# Patient Record
Sex: Male | Born: 1995 | Race: White | Hispanic: No | Marital: Single | Smoking: Never smoker
Health system: Southern US, Community
[De-identification: ages and names within clinical notes are randomized; demographics above are authoritative.]

## PROBLEM LIST (undated history)

## (undated) DIAGNOSIS — F419 Anxiety disorder, unspecified: Secondary | ICD-10-CM

## (undated) DIAGNOSIS — M25371 Other instability, right ankle: Secondary | ICD-10-CM

## (undated) DIAGNOSIS — L709 Acne, unspecified: Secondary | ICD-10-CM

## (undated) HISTORY — PX: ANKLE SURGERY: SHX546

---
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
## 2017-01-30 DIAGNOSIS — Z23 Encounter for immunization: Secondary | ICD-10-CM | POA: Diagnosis not present

## 2017-05-04 DIAGNOSIS — J029 Acute pharyngitis, unspecified: Secondary | ICD-10-CM | POA: Diagnosis not present

## 2017-05-04 DIAGNOSIS — J301 Allergic rhinitis due to pollen: Secondary | ICD-10-CM | POA: Diagnosis not present

## 2017-07-18 DIAGNOSIS — M79644 Pain in right finger(s): Secondary | ICD-10-CM | POA: Diagnosis not present

## 2017-07-18 DIAGNOSIS — M25531 Pain in right wrist: Secondary | ICD-10-CM | POA: Diagnosis not present

## 2017-07-18 DIAGNOSIS — M654 Radial styloid tenosynovitis [de Quervain]: Secondary | ICD-10-CM | POA: Diagnosis not present

## 2017-10-05 DIAGNOSIS — Y9344 Activity, trampolining: Secondary | ICD-10-CM | POA: Diagnosis not present

## 2017-10-05 DIAGNOSIS — W1789XA Other fall from one level to another, initial encounter: Secondary | ICD-10-CM | POA: Diagnosis not present

## 2017-10-05 DIAGNOSIS — W1839XA Other fall on same level, initial encounter: Secondary | ICD-10-CM | POA: Diagnosis not present

## 2017-10-05 DIAGNOSIS — S6992XA Unspecified injury of left wrist, hand and finger(s), initial encounter: Secondary | ICD-10-CM | POA: Diagnosis not present

## 2017-10-05 DIAGNOSIS — M79642 Pain in left hand: Secondary | ICD-10-CM | POA: Diagnosis not present

## 2017-10-05 DIAGNOSIS — S63637A Sprain of interphalangeal joint of left little finger, initial encounter: Secondary | ICD-10-CM | POA: Diagnosis not present

## 2017-11-09 DIAGNOSIS — F331 Major depressive disorder, recurrent, moderate: Secondary | ICD-10-CM | POA: Diagnosis not present

## 2018-01-28 DIAGNOSIS — M25572 Pain in left ankle and joints of left foot: Secondary | ICD-10-CM | POA: Diagnosis not present

## 2018-02-18 DIAGNOSIS — M25572 Pain in left ankle and joints of left foot: Secondary | ICD-10-CM | POA: Diagnosis not present

## 2018-04-18 ENCOUNTER — Emergency Department (HOSPITAL_BASED_OUTPATIENT_CLINIC_OR_DEPARTMENT_OTHER)
Admission: EM | Admit: 2018-04-18 | Discharge: 2018-04-18 | Disposition: A | Discharge status: Home / Self Care | Payer: BLUE CROSS/BLUE SHIELD | Attending: Emergency Medicine | Admitting: Emergency Medicine

## 2018-04-18 ENCOUNTER — Encounter (HOSPITAL_BASED_OUTPATIENT_CLINIC_OR_DEPARTMENT_OTHER): Payer: Self-pay

## 2018-04-18 ENCOUNTER — Other Ambulatory Visit: Payer: Self-pay

## 2018-04-18 ENCOUNTER — Emergency Department (HOSPITAL_BASED_OUTPATIENT_CLINIC_OR_DEPARTMENT_OTHER): Payer: BLUE CROSS/BLUE SHIELD

## 2018-04-18 DIAGNOSIS — R0602 Shortness of breath: Secondary | ICD-10-CM | POA: Diagnosis not present

## 2018-04-18 DIAGNOSIS — R079 Chest pain, unspecified: Secondary | ICD-10-CM | POA: Insufficient documentation

## 2018-04-18 MED ORDER — HYDROXYZINE HCL 25 MG PO TABS: 25.0000 mg | ORAL_TABLET | Freq: Four times a day (QID) | ORAL | 0 refills | Status: DC

## 2018-04-18 MED ORDER — NAPROXEN 500 MG PO TABS: 500.0000 mg | ORAL_TABLET | Freq: Two times a day (BID) | ORAL | 0 refills | Status: DC

## 2018-04-18 NOTE — ED Provider Notes (Signed)
[facility] EMERGENCY DEPARTMENT Provider Note   CSN: [id number] Arrival date & time: 04/18/18  1814    History   Chief Complaint Chief Complaint  Patient presents with  . Chest Pain    HPI Brandon BoughBrandon West is a 23 y.o. male without significant past medical history who presents to the emergency department with complaints of chest pain & shortness of breath which began 2 to 3 days ago.  Patient states that his symptoms began with mild dyspnea that was coming and going without specific triggers or alleviating or aggravating factors.  He eventually developed some chest tightness as well as some chest pain that was coming and going as well.  He states that he has been up since 2 AM this morning and has since had constant chest discomfort and trouble breathing.  He states his current pain is a 5 out of 10 in severity.  He states pain may be worse with deep breaths but is not entirely sure. He notes no other alleviating or aggravating factors.  Denies URI symptoms, wheezing, fever, or chills. Denies leg pain/swelling, hemoptysis, recent surgery/trauma, recent long travel, hormone use, personal hx of cancer, or hx of DVT/PE.  Denies family history of early CAD.  Denies tobacco use marijuana use.    He does admit to increased anxiety over coronavirus. No SI/HI. No known exposures to confirmed covid 19 cases. No travel. He has been working a lot lately and not sleeping as well      HPI  Past Medical History:  Diagnosis Date  . Acne   . 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   Past Surgical History:  Procedure Laterality Date  . ANKLE ARTHROSCOPY Right 01/07/2014   Procedure: RIGHT ANKLE ARTHROSCOPY WITH LIMITED DEBRIDEMENT  ;  Surgeon: Toni ArthursJohn Hewitt, MD;  Location: [facility];  Service: Orthopedics;  Laterality: Right;  . ANKLE RECONSTRUCTION Right 01/07/2014   Procedure: RIGHT ANKLE LATERAL LIGAMENT  RECONSTRUCTION AND PERONEUS BREVIS DEBRIDEMENT;  Surgeon: Toni ArthursJohn Hewitt, MD;  Location: [facility];  Service: Orthopedics;  Laterality: Right;  . ANKLE SURGERY Right   . TEAR DUCT PROBING Bilateral 05/31/2000        Home Medications    Prior to Admission medications   Medication Sig Start Date End Date Taking? Authorizing Provider  cephALEXin (KEFLEX) 500 MG capsule Take 1 capsule (500 mg total) by mouth 2 (two) times daily. Patient not taking: Reported on 05/23/2015 06/27/14   Pisciotta, Joni ReiningNicole, PA-C  HYDROcodone-acetaminophen (NORCO) 5-325 MG per tablet Take 1-2 tablets by mouth every 6 (six) hours as needed. Patient not taking: Reported on 05/23/2015 01/07/14   Toni ArthursHewitt, John, MD  HYDROcodone-acetaminophen (NORCO/VICODIN) 5-325 MG per tablet Take 2 tablets by mouth every 4 (four) hours as needed. Patient not taking: Reported on 05/23/2015 10/04/13   Rolland PorterJames, Mark, MD  ibuprofen (ADVIL,MOTRIN) 200 MG tablet Take 400 mg by mouth every 6 (six) hours as needed for moderate pain.    [provider]  naproxen (NAPROSYN) 500 MG tablet Take 1 tablet (500 mg total) by mouth 2 (two) times daily. 05/23/15   Dowless, Lester KinsmanSamantha Tripp, PA-C    Family History Family History  Problem Relation Age of Onset  . Heart disease Maternal Grandfather        MI    Social History Social History   Tobacco Use  . Smoking status: Never Smoker  Substance Use Topics  . Alcohol use:  No  . Drug use: No     Allergies   Amoxicillin and Amoxil [amoxicillin]   Review of Systems Review of Systems  Constitutional: Negative for chills and fever.  HENT: Negative for congestion, ear pain and sore throat.   Respiratory: Positive for shortness of breath. Negative for cough, choking, wheezing and stridor.   Cardiovascular: Positive for chest pain.  Gastrointestinal: Negative for abdominal pain, nausea and vomiting.  Neurological: Negative for syncope.  Psychiatric/Behavioral: Negative for suicidal  ideas. The patient is nervous/anxious.   All other systems reviewed and are negative.    Physical Exam Updated Vital Signs BP (!) 153/91 (BP Location: Left Arm)   Pulse 66   Temp 98.5 F (36.9 C) (Oral)   Resp 18   Ht  (1.753 m)   Wt 74.8 kg   SpO2 100%   BMI 24.37 kg/m   Physical Exam Vitals signs and nursing note reviewed.  Constitutional:      General: He is not in acute distress.    Appearance: He is well-developed. He is not toxic-appearing.  HENT:     Head: Normocephalic and atraumatic.     Right Ear: Tympanic membrane normal.     Left Ear: Tympanic membrane normal.     Nose: Congestion present.     Right Sinus: No maxillary sinus tenderness or frontal sinus tenderness.     Left Sinus: No maxillary sinus tenderness or frontal sinus tenderness.     Mouth/Throat:     Mouth: Mucous membranes are moist.     Pharynx: Oropharynx is clear. Uvula midline.     Tonsils: No tonsillar exudate.  Eyes:     General:        Right eye: No discharge.        Left eye: No discharge.     Conjunctiva/sclera: Conjunctivae normal.     Pupils: Pupils are equal, round, and reactive to light.  Neck:     Musculoskeletal: Normal range of motion and neck supple. No edema, neck rigidity or crepitus.  Cardiovascular:     Rate and Rhythm: Normal rate and regular rhythm.     Pulses:          Radial pulses are 2+ on the right side and 2+ on the left side.     Heart sounds: No friction rub.  Pulmonary:     Effort: Pulmonary effort is normal. No respiratory distress.     Breath sounds: Normal breath sounds. No decreased breath sounds, wheezing, rhonchi or rales.  Chest:     Chest wall: No tenderness.  Abdominal:     General: There is no distension.     Palpations: Abdomen is soft.     Tenderness: There is no abdominal tenderness.  Musculoskeletal:     Right lower leg: He exhibits no tenderness. No edema.     Left lower leg: He exhibits no tenderness. No edema.  Skin:    General:  Skin is warm and dry.     Findings: No rash.  Neurological:     Mental Status: He is alert.     Comments: Clear speech.   Psychiatric:        Mood and Affect: Mood is anxious (mild).        Behavior: Behavior normal.        Thought Content: Thought content normal. Thought content does not include homicidal or suicidal ideation.     ED Treatments / Results  Labs (all labs ordered are listed, but only  abnormal results are displayed) Labs Reviewed - No data to display  EKG Vent. rate 56 BPM PR interval 146 ms QRS duration 96 ms QT/QTc 376/362 ms P-R-T axes 47 82 67 Sinus bradycardia with sinus arrhythmia Otherwise normal ECG No significant change was found Confirmed by Azalia Bilis ([id number]) on 04/18/2018 7:57:54 PM  Radiology Dg Chest 2 View  Result Date: 04/18/2018 CLINICAL DATA:  Generalized chest pain and shortness of breath. EXAM: CHEST - 2 VIEW COMPARISON:  None. FINDINGS: The heart size and mediastinal contours are within normal limits. Both lungs are clear. The visualized skeletal structures are unremarkable. IMPRESSION: No active cardiopulmonary disease. Electronically Signed   By: Elsie Stain M.D.   On: 04/18/2018 19:13    Procedures Procedures (including critical care time)  Medications Ordered in ED Medications - No data to display   Initial Impression / Assessment and Plan / ED Course  I have reviewed the triage vital signs and the nursing notes.  Pertinent labs & imaging results that were available during my care of the patient were reviewed by me and considered in my medical decision making (see chart for details).   Patient presents to the emergency department with chest pain and shortness of breath.  Patient nontoxic-appearing, no apparent distress, vitals WNL with the exception of mildly elevated blood pressure, doubt HTN emergency.  Patient has an overall reassuring physical exam.  Heart regular rate and rhythm.  Lungs clear to auscultation bilaterally.   No abnormalities noted to chest wall. EKG and chest x-ray per triage are reassuring.. I personally ambulated patient on the monitor, SPO2 remained at 100% and patient did not appear to have increased work of breathing or any signs of respiratory distress.   EKG with sinus rhythm, in young healthy male without RFs doubt ACS.  Chest x-ray negative for infiltrate, pneumothorax, effusion, edema, or fracture/dislocation. PERC negative doubt PE. Considered pericarditis- no friction rub on exam, no positional changes w/ pain, not classic for this. Unclear definitive etiology overall. Patient reports increased anxiety over coronavirus- no cough or fever, no travel/exposures, sxs do not seem consistent with this feel very low chance. Do feel anxiety could be playing a factor with patient's sxs. Education and re-assurance were provided. Will trial atarax for possibly anxiety component will also trial Naproxen for possible pleurisy this would also provide treatment for possible pericarditis which I feel is less likely. PCP follow up. I discussed results, treatment plan, need for follow-up, and return precautions with the patient. Provided opportunity for questions, patient confirmed understanding and is in agreement with plan.    Findings and plan of care discussed with supervising physician Dr. Patria Mane who is in agreement.    Brandon West was evaluated in Emergency Department on 04/18/2018 for the symptoms described in the history of present illness. He/she was evaluated in the context of the global COVID-19 pandemic, which necessitated consideration that the patient might be at risk for infection with the SARS-CoV-2 virus that causes COVID-19. Institutional protocols and algorithms that pertain to the evaluation of patients at risk for COVID-19 are in a state of rapid change based on information released by regulatory bodies including the CDC and federal and state organizations. These policies and algorithms were  followed during the patient's care in the ED.   Final Clinical Impressions(s) / ED Diagnoses   Final diagnoses:  Chest pain, unspecified type    ED Discharge Orders         Ordered    naproxen (NAPROSYN) 500 MG  tablet  2 times daily,   Status:  Discontinued     04/18/18 1955    hydrOXYzine (ATARAX/VISTARIL) 25 MG tablet  Every 6 hours,   Status:  Discontinued     04/18/18 1955    hydrOXYzine (ATARAX/VISTARIL) 25 MG tablet  Every 6 hours     04/18/18 2004    naproxen (NAPROSYN) 500 MG tablet  2 times daily     04/18/18 [address] 04/18/18 Abby Potash, MD 04/18/18 2045

## 2018-04-18 NOTE — ED Triage Notes (Signed)
Chest tightness with subjective SOB worsening over the last two days, pt denies  COVID exposure, denies injury, denies fevers, states they've been 97.5 at home

## 2018-04-18 NOTE — ED Notes (Signed)
2-3 days ago began having mid chest pain, feels tight, takes in a deep breath and pain increases. Last 2 hours has become worse. States nothing increases or worsens chest pain

## 2018-04-18 NOTE — ED Notes (Signed)
ED Provider at bedside. 

## 2018-04-18 NOTE — ED Notes (Signed)
DC instructions and two (2) Rx reviewed with pt, opportunity for questions provided

## 2018-04-18 NOTE — Discharge Instructions (Signed)
You were seen in the emergency department today for chest pain and trouble breathing.  Your EKG and chest x-ray were both reassuring.  We suspect your symptoms may be due to irritation of the lung lining or possibly due to anxiety.  We are sending you home with medicines to treat both of these symptoms: - Naproxen is a nonsteroidal anti-inflammatory medication that will help with pain and swelling. Be sure to take this medication as prescribed with food, 1 pill every 12 hours,  It should be taken with food, as it can cause stomach upset, and more seriously, stomach bleeding. Do not take other nonsteroidal anti-inflammatory medications with this such as Advil, Motrin, Aleve, Mobic, Goodie Powder, or Motrin.    - Atarax-this is a medication used to help anxiety, take this every 6 hours as needed.  We have prescribed you new medication(s) today. Discuss the medications prescribed today with your pharmacist as they can have adverse effects and interactions with your other medicines including over the counter and prescribed medications. Seek medical evaluation if you start to experience new or abnormal symptoms after taking one of these medicines, seek care immediately if you start to experience difficulty breathing, feeling of your throat closing, facial swelling, or rash as these could be indications of a more serious allergic reaction   Follow-up primary care within 3 days.  Return to the ER for new or worsening symptoms including but not limited to worsening pain, worsening trouble breathing, passing out, fever, productive cough, or any other concerns.

## 2018-04-29 DIAGNOSIS — R0789 Other chest pain: Secondary | ICD-10-CM | POA: Diagnosis not present

## 2018-07-18 ENCOUNTER — Emergency Department (HOSPITAL_BASED_OUTPATIENT_CLINIC_OR_DEPARTMENT_OTHER): Payer: BC Managed Care – PPO

## 2018-07-18 ENCOUNTER — Other Ambulatory Visit: Payer: Self-pay

## 2018-07-18 ENCOUNTER — Encounter (HOSPITAL_BASED_OUTPATIENT_CLINIC_OR_DEPARTMENT_OTHER): Payer: Self-pay | Admitting: Emergency Medicine

## 2018-07-18 ENCOUNTER — Emergency Department (HOSPITAL_BASED_OUTPATIENT_CLINIC_OR_DEPARTMENT_OTHER)
Admission: EM | Admit: 2018-07-18 | Discharge: 2018-07-19 | Disposition: A | Discharge status: Home / Self Care | Payer: BC Managed Care – PPO | Attending: Emergency Medicine | Admitting: Emergency Medicine

## 2018-07-18 DIAGNOSIS — R0789 Other chest pain: Secondary | ICD-10-CM | POA: Insufficient documentation

## 2018-07-18 DIAGNOSIS — Z79899 Other long term (current) drug therapy: Secondary | ICD-10-CM | POA: Diagnosis not present

## 2018-07-18 DIAGNOSIS — R0602 Shortness of breath: Secondary | ICD-10-CM | POA: Insufficient documentation

## 2018-07-18 DIAGNOSIS — R0600 Dyspnea, unspecified: Secondary | ICD-10-CM | POA: Diagnosis not present

## 2018-07-18 LAB — CBC WITH DIFFERENTIAL/PLATELET
Abs Immature Granulocytes: 0.02 10*3/uL (ref 0.00–0.07)
Basophils Absolute: 0 10*3/uL (ref 0.0–0.1)
Basophils Relative: 1 %
Eosinophils Absolute: 0.1 10*3/uL (ref 0.0–0.5)
Eosinophils Relative: 3 %
HCT: 41.3 % (ref 39.0–52.0)
Hemoglobin: 13.9 g/dL (ref 13.0–17.0)
Immature Granulocytes: 0 %
Lymphocytes Relative: 41 %
Lymphs Abs: 2.3 10*3/uL (ref 0.7–4.0)
MCH: 29.6 pg (ref 26.0–34.0)
MCHC: 33.7 g/dL (ref 30.0–36.0)
MCV: 88.1 fL (ref 80.0–100.0)
Monocytes Absolute: 0.7 10*3/uL (ref 0.1–1.0)
Monocytes Relative: 12 %
Neutro Abs: 2.5 10*3/uL (ref 1.7–7.7)
Neutrophils Relative %: 43 %
Platelets: 210 10*3/uL (ref 150–400)
RBC: 4.69 MIL/uL (ref 4.22–5.81)
RDW: 11.8 % (ref 11.5–15.5)
WBC: 5.7 10*3/uL (ref 4.0–10.5)
nRBC: 0 % (ref 0.0–0.2)

## 2018-07-18 LAB — BASIC METABOLIC PANEL
Anion gap: 12 (ref 5–15)
BUN: 24 mg/dL — ABNORMAL HIGH (ref 6–20)
CO2: 23 mmol/L (ref 22–32)
Calcium: 9.1 mg/dL (ref 8.9–10.3)
Chloride: 106 mmol/L (ref 98–111)
Creatinine, Ser: 1.09 mg/dL (ref 0.61–1.24)
GFR calc Af Amer: >60 mL/min (ref >60)
GFR calc non Af Amer: >60 mL/min (ref >60)
Glucose, Bld: 102 mg/dL — ABNORMAL HIGH (ref 70–99)
Potassium: 4 mmol/L (ref 3.5–5.1)
Sodium: 141 mmol/L (ref 135–145)

## 2018-07-18 LAB — TROPONIN I (HIGH SENSITIVITY): Troponin I (High Sensitivity): <2 ng/L (ref <18)

## 2018-07-18 NOTE — ED Triage Notes (Signed)
Pt states that yesterday he noticed shortness of breath and chest tightness that started around 1300 and was intermittent. Today around 1300 the shob and pain became constant. States had a similar episode 3 months ago and was evaluated

## 2018-07-18 NOTE — ED Provider Notes (Signed)
[facility] Provider Note   CSN: [id number] Arrival date & time: 07/18/18  2159    History   Chief Complaint Chief Complaint  Patient presents with  . Shortness of Breath  . Chest Pain    HPI Brandon West is a 23 y.o. male with PMH significant for ankle instability here for intermittent central chest pressure and shortness of breath off and on for the past month but worsened and more constant since yesterday. Chest pressure feels like something is sitting on his chest. Had similar episode a few months ago and was seen in the ED and prescribed hydroxyzine and naproxen. He is still taking naproxen but ran out of hydroxyzine. He denies any nausea, vomiting, sudden loss of consciousness, cough, congestion. Pain is not worse with exertion or deep inspiration. Taking deep breaths actually help relieve the pain. He denies FH of early cardiac disease, h/o blood clots, recent illnesses. States his symptoms may be related to anxiety but he isn't sure. Brandon West was recently diagnosed with cancer and this is stressful for him.    Past Medical History:  Diagnosis Date  . Acne   . 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   Past Surgical History:  Procedure Laterality Date  . ANKLE ARTHROSCOPY Right 01/07/2014   Procedure: RIGHT ANKLE ARTHROSCOPY WITH LIMITED DEBRIDEMENT  ;  Surgeon: Brandon Simmer, MD;  Location: [facility];  Service: Orthopedics;  Laterality: Right;  . ANKLE RECONSTRUCTION Right 01/07/2014   Procedure: RIGHT ANKLE LATERAL LIGAMENT RECONSTRUCTION AND PERONEUS BREVIS DEBRIDEMENT;  Surgeon: Brandon Simmer, MD;  Location: [facility];  Service: Orthopedics;  Laterality: Right;  . ANKLE SURGERY Right   . TEAR DUCT PROBING Bilateral 05/31/2000      Home Medications    Prior to Admission medications   Medication Sig Start Date End Date Taking? Authorizing Provider   hydrOXYzine (ATARAX/VISTARIL) 25 MG tablet Take 1 tablet (25 mg total) by mouth every 6 (six) hours. 04/18/18   Petrucelli, Samantha R, PA-C  naproxen (NAPROSYN) 500 MG tablet Take 1 tablet (500 mg total) by mouth 2 (two) times daily. 04/18/18   Petrucelli, Glynda Jaeger, PA-C    Family History Family History  Problem Relation Age of Onset  . Heart disease Maternal Grandfather        MI    Social History Social History   Tobacco Use  . Smoking status: Never Smoker  . Smokeless tobacco: Current User  Substance Use Topics  . Alcohol use: No  . Drug use: No     Allergies   Amoxicillin and Amoxil [amoxicillin]   Review of Systems Review of Systems  Constitutional: Negative for fever.  HENT: Negative for congestion, rhinorrhea and sore throat.   Respiratory: Negative for cough.   Gastrointestinal: Negative for abdominal pain, constipation, diarrhea, nausea and vomiting.  Genitourinary: Negative for difficulty urinating.  Musculoskeletal: Negative for joint swelling.  Skin: Negative for rash.  Neurological: Negative for dizziness, light-headedness and headaches.   Physical Exam Updated Vital Signs BP 126/77   Pulse 61   Temp 98.6 F (37 C) (Oral)   Resp 16   Ht 5\' 10"  (1.778 m)   Wt 77.1 kg   SpO2 99%   BMI 24.39 kg/m   Physical Exam Constitutional:      General: He is not in acute distress.    Appearance: He is well-developed. He is not ill-appearing, toxic-appearing or diaphoretic.  HENT:     Head: Normocephalic.  Cardiovascular:     Rate and Rhythm: Normal rate and regular rhythm.     Heart sounds: No murmur. No friction rub. No gallop.   Pulmonary:     Effort: Pulmonary effort is normal. No tachypnea or respiratory distress.     Breath sounds: Normal breath sounds. No stridor. No decreased breath sounds, wheezing, rhonchi or rales.  Chest:     Chest wall: No mass, tenderness or edema.  Abdominal:     General: Bowel sounds are normal.     Palpations:  Abdomen is soft.     Tenderness: There is no abdominal tenderness. There is no guarding or rebound.  Musculoskeletal: Normal range of motion.     Right lower leg: No edema.     Left lower leg: No edema.  Skin:    General: Skin is warm and dry.     Findings: No rash.  Neurological:     General: No focal deficit present.     Mental Status: He is alert and oriented to person, place, and time.    ED Treatments / Results  Labs (all labs ordered are listed, but only abnormal results are displayed) Labs Reviewed  BASIC METABOLIC PANEL - Abnormal; Notable for the following components:      Result Value   Glucose, Bld 102 (*)    BUN 24 (*)    All other components within normal limits  CBC WITH DIFFERENTIAL/PLATELET  TROPONIN I (HIGH SENSITIVITY)  TROPONIN I (HIGH SENSITIVITY)    EKG EKG Interpretation  Date/Time:  Friday July 18 2018 22:08:38 EDT Ventricular Rate:  70 PR Interval:    QRS Duration: 87 QT Interval:  377 QTC Calculation: [address] Axis:   75 Text Interpretation:  Sinus rhythm No significant change since last tracing Confirmed by Brandon West, Erin ([id number]) on 07/18/2018 10:30:39 PM   Radiology Dg Chest 2 View  Result Date: 07/18/2018 CLINICAL DATA:  23 year old male with chest tightness. EXAM: CHEST - 2 VIEW COMPARISON:  Chest radiograph dated 04/18/2018 FINDINGS: The heart size and mediastinal contours are within normal limits. Both lungs are clear. The visualized skeletal structures are unremarkable. IMPRESSION: No active cardiopulmonary disease. Electronically Signed   By: Brandon CollardArash  West M.D.   On: 07/18/2018 23:08    Procedures Procedures (including critical care time)  Medications Ordered in ED Medications - No data to display   Initial Impression / Assessment and Plan / ED Course  I have reviewed the triage vital signs and the nursing notes.  Pertinent labs & imaging results that were available during my care of the patient were reviewed by me and  considered in my medical decision making (see chart for details).   Healthy Brandon Reichmann22yo M here with 1 month h/o intermittent chest pressure and shortness of breath, worsened in the last day. Well appearing and afebrile with stable vitals. Clear lungs on exam and without U/LRI symptoms. CXR negative for pneumothorax or pneumonia. No friction rub or exacerbation of pain with position changes, EKG unchanged from prior, not likely myocarditis or pericarditis. Generally healthy and without cardiac risk factors, trop negative. Vitals stable and without h/o injury, not likely tamponade. Pain not reproducible, no h/o injury, not likely MSK. CBC negative for infection or anemia. BMP without notable abnormalities. Symptoms likely related to anxiety as previously noted, recommend close follow up with PCP. Stable for discharge.    Final Clinical Impressions(s) / ED Diagnoses   Final diagnoses:  Chest pressure  Dyspnea,  unspecified type    ED Discharge Orders    None       Ellwood DenseRumball, Devinne Epstein, DO 07/19/18 0011    Brandon West, Erin, MD 07/19/18 [id number]

## 2018-07-31 DIAGNOSIS — F419 Anxiety disorder, unspecified: Secondary | ICD-10-CM | POA: Diagnosis not present

## 2018-09-18 DIAGNOSIS — F419 Anxiety disorder, unspecified: Secondary | ICD-10-CM | POA: Diagnosis not present

## 2018-11-13 DIAGNOSIS — F419 Anxiety disorder, unspecified: Secondary | ICD-10-CM | POA: Diagnosis not present

## 2018-11-13 DIAGNOSIS — F4321 Adjustment disorder with depressed mood: Secondary | ICD-10-CM | POA: Diagnosis not present

## 2018-11-13 DIAGNOSIS — R0602 Shortness of breath: Secondary | ICD-10-CM | POA: Diagnosis not present

## 2019-01-27 ENCOUNTER — Ambulatory Visit: Payer: BC Managed Care – PPO | Attending: Internal Medicine

## 2019-01-27 DIAGNOSIS — Z20822 Contact with and (suspected) exposure to covid-19: Secondary | ICD-10-CM

## 2019-01-27 DIAGNOSIS — Z7189 Other specified counseling: Secondary | ICD-10-CM | POA: Diagnosis not present

## 2019-01-29 LAB — NOVEL CORONAVIRUS, NAA: SARS-CoV-2, NAA: NOT DETECTED

## 2019-03-03 ENCOUNTER — Other Ambulatory Visit: Payer: Self-pay

## 2019-03-03 ENCOUNTER — Ambulatory Visit (INDEPENDENT_AMBULATORY_CARE_PROVIDER_SITE_OTHER): Payer: BC Managed Care – PPO | Admitting: Mental Health

## 2019-03-03 DIAGNOSIS — F410 Panic disorder [episodic paroxysmal anxiety] without agoraphobia: Secondary | ICD-10-CM

## 2019-03-03 NOTE — Progress Notes (Signed)
[facility] Initial Adult Exam  Name: Brandon West Date: 03/03/2019 MRN: [id number] DOB: Feb 19, 1995 PCP: Orpah Melter, MD  Time spent: 53 minutes  Reason for Visit /Presenting Problem: in 2018, he was going to college. That Summer his grandfather passed away, he found him when he passed. He went to college for a semester but then came home after learning his grandmother had been dx'd w/ cancer. His grandmother passed November from cancer. The past 2 years have been difficult. He stated his anxiety is persistent, ruminates increases at night. Some days w/ low motivation.  At age 24 he coped with depression. His support system is his girlfriend, friends, family.  He works full time, questions if he will be working the same job forever. He works in Radiation protection practitioner. He is trying to figure out if what direction to go w/ his life in terms of career. He tried to go to the gym daily to cope, video games to lower anxiety but it returns.  Since last March, he began having shortness of breath and chest pressure; this occurs every few days. He went urgent care several times due to the attacks.   Mental Status Exam:   Appearance:   Casual     Behavior:  Appropriate  Motor:  Normal  Speech/Language:   Clear and Coherent  Affect:  constricted  Mood:  anxious  Thought process:  normal  Thought content:    WNL  Sensory/Perceptual disturbances:    WNL  Orientation:  x4  Attention:  Good  Concentration:  Good  Memory:  WNL  Fund of knowledge:   Good  Insight:    Good  Judgment:   Good  Impulse Control:  Good   Reported Symptoms:   Some interrupted sleep, anxiety daily 5/6 in severity, some depression  Risk Assessment: Danger to Self:  No Self-injurious Behavior: No Danger to Others: No Duty to Warn:no Physical Aggression / Violence:No  Access to Firearms a concern: No  Gang Involvement:No  Patient / guardian was educated about steps to take if suicide or homicide risk level  increases between visits: yes While future psychiatric events cannot be accurately predicted, the patient does not currently require acute inpatient psychiatric care and does not currently meet [facility] involuntary commitment criteria.  Substance Abuse History: Current substance abuse: none  Past Psychiatric History:   Outpatient Providers: therapy at age 24 History of Psych Hospitalization: none Psychological Testing: none  Abuse History: Victim - none Report needed: No. Victim of Neglect:No. Perpetrator of none  Witness / Exposure to Domestic Violence: No   Protective Services Involvement: No  Witness to [employer] Violence:  No   Family History:     Mother- Megan    Father-  Dalene Seltzer (stepfather)   Sister- Caitlyn age 19    Biological father (no contact since he was born)  Family History  Problem Relation Age of Onset  . Heart disease Maternal Grandfather        MI    Living situation: the patient lives w/ his roommate}  Sexual Orientation:  Straight  Relationship Status:  Dating gf for 2 yrs  Name of spouse / other: Lexi             If a parent, number of children / ages: none  Support Systems;  Family, friends  Financial Stress:   none  Income/Employment/Disability:  Full time  Armed forces logistics/support/administrative officer: No   Educational History: Education: high school diploma/GED  Religion/Sprituality/World View:   none  Any  cultural differences that may affect / interfere with treatment:  none  Recreation/Hobbies: video games, workout, outdoors  Stressors:  none  Strengths:  support system, motivated for change  Barriers:  none  Legal History: Pending legal issue / charges: none History of legal issue / charges: none  Medical History/Surgical History: Past Medical History:  Diagnosis Date  . Acne   . 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 ANKLE ARTHROSCOPY Right 01/07/2014   Procedure: RIGHT ANKLE ARTHROSCOPY  WITH LIMITED DEBRIDEMENT  ;  Surgeon: Toni Arthurs, MD;  Location: [facility];  Service: Orthopedics;  Laterality: Right;  . ANKLE RECONSTRUCTION Right 01/07/2014   Procedure: RIGHT ANKLE LATERAL LIGAMENT RECONSTRUCTION AND PERONEUS BREVIS DEBRIDEMENT;  Surgeon: Toni Arthurs, MD;  Location: [facility];  Service: Orthopedics;  Laterality: Right;  . ANKLE SURGERY Right   . TEAR DUCT PROBING Bilateral 05/31/2000    Medications: Current Outpatient Medications  Medication Sig Dispense Refill  . hydrOXYzine (ATARAX/VISTARIL) 25 MG tablet Take 1 tablet (25 mg total) by mouth every 6 (six) hours. 10 tablet 0  . naproxen (NAPROSYN) 500 MG tablet Take 1 tablet (500 mg total) by mouth 2 (two) times daily. 10 tablet 0   No current facility-administered medications for this visit.    Allergies  Allergen Reactions  . Amoxicillin Hives  . Amoxil [Amoxicillin] Rash    Diagnoses:  No diagnosis found.  Plan of Care: To be completed   Waldron Session, [facility]

## 2019-04-14 ENCOUNTER — Ambulatory Visit (INDEPENDENT_AMBULATORY_CARE_PROVIDER_SITE_OTHER): Payer: BC Managed Care – PPO | Admitting: Mental Health

## 2019-04-14 ENCOUNTER — Other Ambulatory Visit: Payer: Self-pay

## 2019-04-14 DIAGNOSIS — F410 Panic disorder [episodic paroxysmal anxiety] without agoraphobia: Secondary | ICD-10-CM

## 2019-04-14 NOTE — Progress Notes (Signed)
[facility] Psychotherapy note  Name: Brandon West Date: 04/14/2019 MRN: [id number] DOB: 10/21/1995 PCP: Joycelyn Rua, MD  Time spent: 53 minutes  Treatment: individual therapy  Mental Status Exam:   Appearance:   Casual     Behavior:  Appropriate  Motor:  Normal  Speech/Language:   Clear and Coherent  Affect:  constricted  Mood:  anxious  Thought process:  normal  Thought content:    WNL  Sensory/Perceptual disturbances:    WNL  Orientation:  x4  Attention:  Good  Concentration:  Good  Memory:  WNL  Fund of knowledge:   Good  Insight:    Good  Judgment:   Good  Impulse Control:  Good   Reported Symptoms:   Some interrupted sleep, anxiety daily 5/6 in severity, some depression  Risk Assessment: Danger to Self:  No Self-injurious Behavior: No Danger to Others: No Duty to Warn:no Physical Aggression / Violence:No  Access to Firearms a concern: No  Gang Involvement:No  Patient / guardian was educated about steps to take if suicide or homicide risk level increases between visits: yes While future psychiatric events cannot be accurately predicted, the patient does not currently require acute inpatient psychiatric care and does not currently meet [facility] involuntary commitment criteria.   Subjective:  Patient arrived on time for today's session.  Continue to assess some history as well as progress since our last session, recent events.  He shared how he continues to work at a [employer].  He shared how he broke up with his girlfriend about 3 weeks ago, how this is reduced some stress, going on to provide some details of the relationship and how he made this decision.  He shared some work-related stress as he continues to consider other options such as going to school, through discovery identified needing to make a change probably within the year.  He shared how this would also be a commitment he would make toward his grandmother who passed a  few months ago, as she wanted him to go to college.  He went on to share the significance of his relationship with her and also his grandfather, sharing past experiences with them growing up spending time often at their house.  He continues to report SUD as a 7 or 8 referring to his anxiety, some panic attacks during the week.  We discussed the potential benefits of engaging in a psychiatric evaluation for potential medication where he was agreeable to schedule following today's session.  Discussed diaphragmatic breathing in detail to utilize between sessions with mindfulness concepts.  Interventions: CBT, supportive therapy, problem solving approaches  Medical History/Surgical History: Past Medical History:  Diagnosis Date  . Acne   . 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 ANKLE ARTHROSCOPY Right 01/07/2014   Procedure: RIGHT ANKLE ARTHROSCOPY WITH LIMITED DEBRIDEMENT  ;  Surgeon: Toni Arthurs, MD;  Location: [facility];  Service: Orthopedics;  Laterality: Right;  . ANKLE RECONSTRUCTION Right 01/07/2014   Procedure: RIGHT ANKLE LATERAL LIGAMENT RECONSTRUCTION AND PERONEUS BREVIS DEBRIDEMENT;  Surgeon: Toni Arthurs, MD;  Location: [facility];  Service: Orthopedics;  Laterality: Right;  . ANKLE SURGERY Right   . TEAR DUCT PROBING Bilateral 05/31/2000    Medications: Current Outpatient Medications  Medication Sig Dispense Refill  . hydrOXYzine (ATARAX/VISTARIL) 25 MG tablet Take 1 tablet (25 mg total) by mouth every 6 (six) hours. 10 tablet 0  . naproxen (NAPROSYN) 500  MG tablet Take 1 tablet (500 mg total) by mouth 2 (two) times daily. 10 tablet 0   No current facility-administered medications for this visit.    Allergies  Allergen Reactions  . Amoxicillin Hives  . Amoxil [Amoxicillin] Rash    Diagnoses:    ICD-10-CM   1. Panic attack  F41.0      Plan: Patient is to use CBT, mindfulness and coping  skills to help decrease symptoms. Patient is is to follow through with coping skills discussed, as well as psychiatric evaluation.   Long-term goal:  Reduce overall level, frequency, and intensity of the feelings of anxiety and panic from SUD 7 or 8 to 0 or 1 for at least 3 consecutive months.  Short-term goal: To identify and process feelings related to grief and loss associated with his grandparents                    Decrease anxiety levels by utilizing coping skills discussed in session.                               Verbalize an understanding of the role that distorted thinking plays in creating fears, excessive worry, and ruminations.  Assessment of progress:  progressing    Anson [place], [facility]

## 2019-05-06 ENCOUNTER — Encounter: Payer: Self-pay | Admitting: Adult Health

## 2019-05-06 ENCOUNTER — Ambulatory Visit (INDEPENDENT_AMBULATORY_CARE_PROVIDER_SITE_OTHER): Payer: BC Managed Care – PPO | Admitting: Mental Health

## 2019-05-06 ENCOUNTER — Ambulatory Visit (INDEPENDENT_AMBULATORY_CARE_PROVIDER_SITE_OTHER): Payer: BC Managed Care – PPO | Admitting: Adult Health

## 2019-05-06 ENCOUNTER — Other Ambulatory Visit: Payer: Self-pay

## 2019-05-06 DIAGNOSIS — F411 Generalized anxiety disorder: Secondary | ICD-10-CM | POA: Diagnosis not present

## 2019-05-06 DIAGNOSIS — F410 Panic disorder [episodic paroxysmal anxiety] without agoraphobia: Secondary | ICD-10-CM

## 2019-05-06 MED ORDER — ESCITALOPRAM OXALATE 10 MG PO TABS: ORAL_TABLET | ORAL | 2 refills | Status: DC

## 2019-05-06 MED ORDER — LORAZEPAM 0.5 MG PO TABS: 0.5000 mg | ORAL_TABLET | Freq: Every day | ORAL | 0 refills | Status: DC | PRN

## 2019-05-06 NOTE — Progress Notes (Signed)
[facility] Psychotherapy note  Name: Brandon West Date: 05/06/2019 MRN: [id number] DOB: [birth year] PCP: Joycelyn Rua, MD  Time spent: 53 minutes  Treatment: individual therapy  Mental Status Exam:   Appearance:   Casual     Behavior:  Appropriate  Motor:  Normal  Speech/Language:   Clear and Coherent  Affect:  constricted  Mood:  anxious  Thought process:  normal  Thought content:    WNL  Sensory/Perceptual disturbances:    WNL  Orientation:  x4  Attention:  Good  Concentration:  Good  Memory:  WNL  Fund of knowledge:   Good  Insight:    Good  Judgment:   Good  Impulse Control:  Good   Reported Symptoms:   Some interrupted sleep, anxiety daily 5/6 in severity, some depression  Risk Assessment: Danger to Self:  No Self-injurious Behavior: No Danger to Others: No Duty to Warn:no Physical Aggression / Violence:No  Access to Firearms a concern: No  Gang Involvement:No  Patient / guardian was educated about steps to take if suicide or homicide risk level increases between visits: yes While future psychiatric events cannot be accurately predicted, the patient does not currently require acute inpatient psychiatric care and does not currently meet [facility] involuntary commitment criteria.   Subjective:  Patient presents for session, sharing progress.  He stated he has had more anxiety over the last few days, some recent panic episodes.  Anxiety continues to manifest more at night, rumination persists.  He was able to identify more specifics with guidance, such as worrying about keeping his house clean keeping up with tasks, some worry about finances at times, snacking more at night.  He engages in an exercise regimen, working out throughout the week and tends to worry about his food choices at times and how that may affect his weight.  Patient identified how he has to "stay busy" to keep his mind occupied so he will not began having more anxiety.  He stated  he has practiced the diaphragmatic breathing exercises discussed last session and they have been helpful at times but his anxiety persists.  We reviewed the potential benefits of medication and he has an appointment later today with Yvette Rack, NP.  We provided some psychoeducation related CBT, providing him resource for review between sessions and encouraging him to increase his awareness of his anxiety producing cognitions.  Interventions: CBT, supportive therapy, problem solving approaches  Medical History/Surgical History: Past Medical History:  Diagnosis Date  . Acne   . 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 ANKLE ARTHROSCOPY Right 01/07/2014   Procedure: RIGHT ANKLE ARTHROSCOPY WITH LIMITED DEBRIDEMENT  ;  Surgeon: Toni Arthurs, MD;  Location: [facility];  Service: Orthopedics;  Laterality: Right;  . ANKLE RECONSTRUCTION Right 01/07/2014   Procedure: RIGHT ANKLE LATERAL LIGAMENT RECONSTRUCTION AND PERONEUS BREVIS DEBRIDEMENT;  Surgeon: Toni Arthurs, MD;  Location: [facility];  Service: Orthopedics;  Laterality: Right;  . ANKLE SURGERY Right   . TEAR DUCT PROBING Bilateral 05/31/2000    Medications: Current Outpatient Medications  Medication Sig Dispense Refill  . hydrOXYzine (ATARAX/VISTARIL) 25 MG tablet Take 1 tablet (25 mg total) by mouth every 6 (six) hours. 10 tablet 0  . naproxen (NAPROSYN) 500 MG tablet Take 1 tablet (500 mg total) by mouth 2 (two) times daily. 10 tablet 0   No current facility-administered medications for this visit.    Allergies  Allergen Reactions  .  Amoxicillin Hives  . Amoxil [Amoxicillin] Rash    Diagnoses:    ICD-10-CM   1. Panic attack  F41.0      Plan: Patient is to use CBT, mindfulness and coping skills to help decrease symptoms. Patient is is to follow through with coping skills discussed, as well as psychiatric evaluation.   Long-term goal:  Reduce  overall level, frequency, and intensity of the feelings of anxiety and panic from SUD 7 or 8 to 0 or 1 for at least 3 consecutive months.  Short-term goal: To identify and process feelings related to grief and loss associated with his grandparents                    Decrease anxiety levels by utilizing coping skills discussed in session.                               Verbalize an understanding of the role that distorted thinking plays in creating fears, excessive worry, and ruminations.  Assessment of progress:  progressing    Anson [place], [facility]

## 2019-05-06 NOTE — Progress Notes (Signed)
[facility] MD/PA/NP Initial Note  05/06/2019 3:38 PM Brandon West  MRN:  [id number]  Chief Complaint:   HPI:   Describes mood today as "ok". Pleasant. Mood symptoms - denies depression. Irritable sometimes. Feels more anxious overall. Has panic attacks - "some days worse and others not at all". Seen at the ED 4 times in a year worried it was cardiac related - "everything was negative". Worries about "everything". Worries about "fitness - tight on that". Feels like anxiety is causing him to snack a lot which adds to his anxiety. Parents may be separating. Stressors over finances. Questions if he's doing this "perfect or right"? Stating "it can be as simple as grocery shopping". Keeps room neat and orderly. Likes for things to be in order. Stating "it's all in my head". Tries to talk self out of things, but can't. Lost grandfather 2 years ago unexpectedly - "I found him". Grandmother passed away of cancer 6 months later. Went to [employer] for a semester and when home for break, found out grandmother passed away. Did not return to college - took a few classes at [employer]. Stable interest and motivation. Taking medications as prescribed.  Energy levels stable. Active, has a regular exercise routine - Gym 5 to 6 days a week. Walking 15 to 20 miles a day at work.  Enjoys some usual interests and activities. Single. Lives with a room-mate . Family local - [place]. Has an 45 year old sister. Spending time with family and friends.  Appetite adequate. Weight stable. Sleeps well most nights. Averages 6 to 7 hours. Focus and concentration stable. Completing tasks. Managing aspects of household. 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  Denies SI or HI. Denies AH or VH. Consumes alcohol 3 times a week - a few beers 2 of those nights and more on the weekend.   Previous medication trials: Lexapro 7 to 8 years ago for depression.  Visit Diagnosis:    ICD-10-CM   1. Generalized anxiety disorder  F41.1    2. Panic attack  F41.0     Past Psychiatric History: Denies psychiatric hospitalization.  Past Medical History:  Past Medical History:  Diagnosis Date  . Acne   . 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 ANKLE ARTHROSCOPY Right 01/07/2014   Procedure: RIGHT ANKLE ARTHROSCOPY WITH LIMITED DEBRIDEMENT  ;  Surgeon: Toni Arthurs, MD;  Location: [facility];  Service: Orthopedics;  Laterality: Right;  . ANKLE RECONSTRUCTION Right 01/07/2014   Procedure: RIGHT ANKLE LATERAL LIGAMENT RECONSTRUCTION AND PERONEUS BREVIS DEBRIDEMENT;  Surgeon: Toni Arthurs, MD;  Location: [facility];  Service: Orthopedics;  Laterality: Right;  . ANKLE SURGERY Right   . TEAR DUCT PROBING Bilateral 05/31/2000    Family Psychiatric History: Denies any family history of mental illness. Family History:  Family History  Problem Relation Age of Onset  . Heart disease Maternal Grandfather        MI    Social History:  Social History   Socioeconomic History  . Marital status: Single    Spouse name: Not on file  . Number of children: Not on file  . Years of education: Not on file  . Highest education level: Not on file  Occupational History  . Not on file  Tobacco Use  . Smoking status: Never Smoker  . Smokeless tobacco: Current User  Substance and Sexual Activity  . Alcohol use: No  . Drug use: No  .  Sexual activity: Not on file  Other Topics Concern  . Not on file  Social History Narrative   ** Merged History Encounter **       Social Determinants of Health   Financial Resource Strain:   . Difficulty of Paying Living Expenses:   Food Insecurity:   . Worried About Charity fundraiser in the Last Year:   . Arboriculturist in the Last Year:   Transportation Needs:   . Film/video editor (Medical):   Marland Kitchen Lack of Transportation (Non-Medical):   Physical Activity:   . Days of Exercise per Week:   . Minutes of  Exercise per Session:   Stress:   . Feeling of Stress :   Social Connections:   . Frequency of Communication with Friends and Family:   . Frequency of Social Gatherings with Friends and Family:   . Attends Religious Services:   . Active Member of Clubs or Organizations:   . Attends Archivist Meetings:   Marland Kitchen Marital Status:     Allergies:  Allergies  Allergen Reactions  . Amoxicillin Hives  . Amoxil [Amoxicillin] Rash    Metabolic Disorder Labs: No results found for: HGBA1C, MPG No results found for: PROLACTIN No results found for: CHOL, TRIG, HDL, CHOLHDL, VLDL, LDLCALC No results found for: TSH  Therapeutic Level Labs: No results found for: LITHIUM No results found for: VALPROATE No components found for:  CBMZ  Current Medications: No current outpatient medications on file.   No current facility-administered medications for this visit.    Medication Side Effects: none  Orders placed this visit:  No orders of the defined types were placed in this encounter.   Psychiatric Specialty Exam:  Review of Systems  Musculoskeletal: Negative for gait problem.  Neurological: Negative for tremors.  Psychiatric/Behavioral:       Please refer to HPI    There were no vitals taken for this visit.There is no height or weight on file to calculate BMI.  General Appearance: Neat and Well Groomed  Eye Contact:  Good  Speech:  Clear and Coherent and Normal Rate  Volume:  Normal  Mood:  Anxious  Affect:  Appropriate and Congruent  Thought Process:  Coherent  Orientation:  Full (Time, Place, and Person)  Thought Content: Logical   Suicidal Thoughts:  No  Homicidal Thoughts:  No  Memory:  WNL  Judgement:  Good  Insight:  Good  Psychomotor Activity:  Normal  Concentration:  Concentration: Good  Recall:  Good  Fund of Knowledge: Good  Language: Good  Assets:  Communication Skills Desire for Improvement Financial Resources/Insurance Housing Intimacy Leisure  Time Physical Health Resilience Social Support Talents/Skills Transportation Vocational/Educational  ADL's:  Intact  Cognition: WNL  Prognosis:  Good   Screenings: None  Receiving Psychotherapy: Yes - Lanetta Inch  Treatment Plan/Recommendations:   Plan:  PDMP reviewed  1. Add Lexapro 10mg  - 1/2 tablet daily x 7 days, then one tablet daily. 2. Add Ativan 0.5mg  daily prn panic attacks  Read and reviewed note with patient for accuracy.   RTC 4 weeks  Patient advised to contact office with any questions, adverse effects, or acute worsening in signs and symptoms.  Discussed potential benefits, risk, and side effects of benzodiazepines to include potential risk of tolerance and dependence, as well as possible drowsiness.  Advised patient not to drive if experiencing drowsiness and to take lowest possible effective dose to minimize risk of dependence and tolerance.  Aloha Gell, NP

## 2019-05-18 DIAGNOSIS — F419 Anxiety disorder, unspecified: Secondary | ICD-10-CM | POA: Diagnosis not present

## 2019-05-18 DIAGNOSIS — J342 Deviated nasal septum: Secondary | ICD-10-CM | POA: Diagnosis not present

## 2019-05-18 DIAGNOSIS — R0602 Shortness of breath: Secondary | ICD-10-CM | POA: Diagnosis not present

## 2019-05-19 ENCOUNTER — Telehealth: Payer: Self-pay | Admitting: Adult Health

## 2019-05-19 ENCOUNTER — Other Ambulatory Visit: Payer: Self-pay | Admitting: Adult Health

## 2019-05-19 DIAGNOSIS — F410 Panic disorder [episodic paroxysmal anxiety] without agoraphobia: Secondary | ICD-10-CM

## 2019-05-19 DIAGNOSIS — F411 Generalized anxiety disorder: Secondary | ICD-10-CM

## 2019-05-19 MED ORDER — LORAZEPAM 0.5 MG PO TABS: ORAL_TABLET | ORAL | 2 refills | Status: AC

## 2019-05-19 NOTE — Telephone Encounter (Signed)
Script sent  

## 2019-05-19 NOTE — Telephone Encounter (Signed)
[facility] called wanting to discuss the Lorazepam.  It is not working well.  Maybe needs a higher dose or a different medication.  Please call to discuss.  Appt 5/10.  Pharmacy [facility] [phone]

## 2019-05-20 ENCOUNTER — Other Ambulatory Visit (HOSPITAL_COMMUNITY): Payer: Self-pay | Admitting: Respiratory Therapy

## 2019-05-20 DIAGNOSIS — R0602 Shortness of breath: Secondary | ICD-10-CM

## 2019-05-24 DIAGNOSIS — Z03818 Encounter for observation for suspected exposure to other biological agents ruled out: Secondary | ICD-10-CM | POA: Diagnosis not present

## 2019-05-24 DIAGNOSIS — Z20828 Contact with and (suspected) exposure to other viral communicable diseases: Secondary | ICD-10-CM | POA: Diagnosis not present

## 2019-05-25 ENCOUNTER — Ambulatory Visit: Payer: BC Managed Care – PPO | Admitting: Mental Health

## 2019-05-29 DIAGNOSIS — R05 Cough: Secondary | ICD-10-CM | POA: Diagnosis not present

## 2019-05-29 DIAGNOSIS — R0981 Nasal congestion: Secondary | ICD-10-CM | POA: Diagnosis not present

## 2019-05-29 DIAGNOSIS — J309 Allergic rhinitis, unspecified: Secondary | ICD-10-CM | POA: Diagnosis not present

## 2019-06-01 ENCOUNTER — Encounter: Payer: Self-pay | Admitting: Adult Health

## 2019-06-01 ENCOUNTER — Ambulatory Visit (INDEPENDENT_AMBULATORY_CARE_PROVIDER_SITE_OTHER): Payer: BC Managed Care – PPO | Admitting: Mental Health

## 2019-06-01 ENCOUNTER — Other Ambulatory Visit: Payer: Self-pay

## 2019-06-01 ENCOUNTER — Ambulatory Visit (INDEPENDENT_AMBULATORY_CARE_PROVIDER_SITE_OTHER): Payer: BC Managed Care – PPO | Admitting: Adult Health

## 2019-06-01 DIAGNOSIS — F410 Panic disorder [episodic paroxysmal anxiety] without agoraphobia: Secondary | ICD-10-CM

## 2019-06-01 DIAGNOSIS — F411 Generalized anxiety disorder: Secondary | ICD-10-CM

## 2019-06-01 NOTE — Progress Notes (Signed)
Brandon West [id number] 1995/11/20 24 y.o.  Subjective:   Patient ID:  Brandon West is a 24 y.o. (DOB 12/13/1995) male.  Chief Complaint: No chief complaint on file.   HPI Tou Hayner presents to the office today for follow-up of GAD and panic attacks.  Describes mood today as "ok". Pleasant. Mood symptoms - denies depression. Irritable sometimes. Feels more anxious overall. Has noticed an improvement with the Lexapro over the past week and a half. Decreased panic attacks - "not as harsh as they were". Decreased worry, but still "strong". Decreased "snacking". Eating 3 full meals a day to prevent snacking. Still experiencing obsessional thoughts and acts. Visited mother over weekend for Mother's Day. Stable interest and motivation. Taking medications as prescribed.  Energy levels stable. Active, has a regular exercise routine - Gym 5 to 6 days a week. Walking 15 to 20 miles a day at work.  Enjoys some usual interests and activities. Single. Lives with a room-mate. Family local - [place]. Has an 40 year old sister. Spending time with family and friends.  Appetite adequate. Weight stable. Sleeps better some nights than others. Averages 5 to 6 hours. Using Melatonin to get to sleep. Focus and concentration stable. Completing tasks. Managing aspects of household. 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  Denies SI or HI. Denies AH or VH. Consumes alcohol 3 times a week - a few beers 2 of those nights and more on the weekend.   Previous medication trials: Lexapro 7 to 8 years ago for depression.  Review of Systems:  Review of Systems  Musculoskeletal: Negative for gait problem.  Neurological: Negative for tremors.  Psychiatric/Behavioral:       Please refer to HPI    Medications: I have reviewed the patient's current medications.  Current Outpatient Medications  Medication Sig Dispense Refill  . escitalopram (LEXAPRO) 10 MG tablet Take 1/2 tablet daily x 7  days, then one tablet daily. 30 tablet 2  . LORazepam (ATIVAN) 0.5 MG tablet Take one tablet three times daily as needed for anxiety/paic attacks. 90 tablet 2   No current facility-administered medications for this visit.    Medication Side Effects: None  Allergies:  Allergies  Allergen Reactions  . Amoxicillin Hives  . Amoxil [Amoxicillin] Rash    Past Medical History:  Diagnosis Date  . Acne   . 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 Heart disease Maternal Grandfather        MI    Social History   Socioeconomic History  . Marital status: Single    Spouse name: Not on file  . Number of children: Not on file  . Years of education: Not on file  . Highest education level: Not on file  Occupational History  . Not on file  Tobacco Use  . Smoking status: Never Smoker  . Smokeless tobacco: Current User  Substance and Sexual Activity  . Alcohol use: No  . Drug use: No  . Sexual activity: Not on file  Other Topics Concern  . Not on file  Social History Narrative   ** Merged History Encounter **       Social Determinants of Health   Financial Resource Strain:   . Difficulty of Paying Living Expenses:   Food Insecurity:   . Worried About Programme researcher, broadcasting/film/video in the Last Year:   . Barista in the Last Year:   Transportation Needs:   .  Lack of Transportation (Medical):   Marland Kitchen Lack of Transportation (Non-Medical):   Physical Activity:   . Days of Exercise per Week:   . Minutes of Exercise per Session:   Stress:   . Feeling of Stress :   Social Connections:   . Frequency of Communication with Friends and Family:   . Frequency of Social Gatherings with Friends and Family:   . Attends Religious Services:   . Active Member of Clubs or Organizations:   . Attends Archivist Meetings:   Marland Kitchen Marital Status:   Intimate Partner Violence:   . Fear of Current or Ex-Partner:   . Emotionally Abused:   Marland Kitchen Physically  Abused:   . Sexually Abused:     Past Medical History, Surgical history, Social history, and Family history were reviewed and updated as appropriate.   Please see review of systems for further details on the patient's review from today.   Objective:   Physical Exam:  There were no vitals taken for this visit.  Physical Exam Constitutional:      General: He is not in acute distress. Musculoskeletal:        General: No deformity.  Neurological:     Mental Status: He is alert and oriented to person, place, and time.     Coordination: Coordination normal.  Psychiatric:        Attention and Perception: Attention and perception normal. He does not perceive auditory or visual hallucinations.        Mood and Affect: Mood normal. Mood is not anxious or depressed. Affect is not labile, blunt, angry or inappropriate.        Speech: Speech normal.        Behavior: Behavior normal.        Thought Content: Thought content normal. Thought content is not paranoid or delusional. Thought content does not include homicidal or suicidal ideation. Thought content does not include homicidal or suicidal plan.        Cognition and Memory: Cognition and memory normal.        Judgment: Judgment normal.     Comments: Insight intact     Lab Review:     Component Value Date/Time   NA 141 07/18/2018 2304   K 4.0 07/18/2018 2304   CL 106 07/18/2018 2304   CO2 23 07/18/2018 2304   GLUCOSE 102 (H) 07/18/2018 2304   BUN 24 (H) 07/18/2018 2304   CREATININE 1.09 07/18/2018 2304   CALCIUM 9.1 07/18/2018 2304   GFRNONAA >60 07/18/2018 2304   GFRAA >60 07/18/2018 2304       Component Value Date/Time   WBC 5.7 07/18/2018 2304   RBC 4.69 07/18/2018 2304   HGB 13.9 07/18/2018 2304   HCT 41.3 07/18/2018 2304   PLT 210 07/18/2018 2304   MCV 88.1 07/18/2018 2304   MCH 29.6 07/18/2018 2304   MCHC 33.7 07/18/2018 2304   RDW 11.8 07/18/2018 2304   LYMPHSABS 2.3 07/18/2018 2304   MONOABS 0.7 07/18/2018  2304   EOSABS 0.1 07/18/2018 2304   BASOSABS 0.0 07/18/2018 2304    No results found for: POCLITH, LITHIUM   No results found for: PHENYTOIN, PHENOBARB, VALPROATE, CBMZ   .res Assessment: Plan:   PDMP reviewed  1.  Lexapro 10mg  tablet daily. 2.  Ativan 0.5mg  BID panic attacks   RTC 4 weeks  Patient advised to contact office with any questions, adverse effects, or acute worsening in signs and symptoms.  Discussed potential benefits, risk, and side  effects of benzodiazepines to include potential risk of tolerance and dependence, as well as possible drowsiness.  Advised patient not to drive if experiencing drowsiness and to take lowest possible effective dose to minimize risk of dependence and tolerance.  Diagnoses and all orders for this visit:  Panic attack  Generalized anxiety disorder     Please see After Visit Summary for patient specific instructions.  Future Appointments  Date Time Provider Department Center  06/09/2019  2:00 PM [facility]EN[facility] None  06/11/2019 12:00 PM [facility] TECH [facility] None  06/18/2019 10:00 AM [facility], [facility] None    No orders of the defined types were placed in this encounter.   -------------------------------

## 2019-06-01 NOTE — Progress Notes (Signed)
[facility] Counselor Psychotherapy note  Name: Brandon West Date: 06/01/2019 MRN: [id number] DOB: 08/16/1995 PCP: Joycelyn Rua, MD  Time spent: 53 minutes  Treatment: individual therapy  Mental Status Exam:   Appearance:   Casual     Behavior:  Appropriate  Motor:  Normal  Speech/Language:   Clear and Coherent  Affect:  constricted  Mood:  anxious  Thought process:  normal  Thought content:    WNL  Sensory/Perceptual disturbances:    WNL  Orientation:  x4  Attention:  Good  Concentration:  Good  Memory:  WNL  Fund of knowledge:   Good  Insight:    Good  Judgment:   Good  Impulse Control:  Good   Reported Symptoms:   Some interrupted sleep, anxiety daily 5/6 in severity, some depression  Risk Assessment: Danger to Self:  No Self-injurious Behavior: No Danger to Others: No Duty to Warn:no Physical Aggression / Violence:No  Access to Firearms a concern: No  Gang Involvement:No  Patient / guardian was educated about steps to take if suicide or homicide risk level increases between visits: yes While future psychiatric events cannot be accurately predicted, the patient does not currently require acute inpatient psychiatric care and does not currently meet [facility] involuntary commitment criteria.   Subjective:  Patient presents for session sharing progress. Stated that he continues to go to the gym throughout the week, as this is one of his main outlets for stress management. He continues also to have work-related stress, sharing how he has had to work many hours of overtime. He is not satisfied with his job, wants to make a change and is considering moving back to his parents house possibly in August and engaging in Medical sales representative school. He also feels being there with him will give him some needed support and comfort, he is often alone in his apartment, roommate typically gone for 3 weeks at a time. This appears to affect him, increasing his rumination particularly  at night. He has started taking melatonin with some positive effects to get to sleep quicker, may take about 30 to 40 minutes versus a few hours. Feels his current medications have been helpful and plans to attend his medication management appointment later today. He identified negative cognitions such as "I'm backtracking" referring to his progress and possibly having to move back home. We assisted him in identifying how he is identifying needed steps to take for self-care. We reviewed coping, diaphragmatic breathing and some CBT concepts, thought blocking.  Interventions: CBT, supportive therapy, problem solving approaches  Medical History/Surgical History: Past Medical History:  Diagnosis Date  . Acne   . 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 ANKLE ARTHROSCOPY Right 01/07/2014   Procedure: RIGHT ANKLE ARTHROSCOPY WITH LIMITED DEBRIDEMENT  ;  Surgeon: Toni Arthurs, MD;  Location: [facility];  Service: Orthopedics;  Laterality: Right;  . ANKLE RECONSTRUCTION Right 01/07/2014   Procedure: RIGHT ANKLE LATERAL LIGAMENT RECONSTRUCTION AND PERONEUS BREVIS DEBRIDEMENT;  Surgeon: Toni Arthurs, MD;  Location: [facility];  Service: Orthopedics;  Laterality: Right;  . ANKLE SURGERY Right   . TEAR DUCT PROBING Bilateral 05/31/2000    Medications: Current Outpatient Medications  Medication Sig Dispense Refill  . escitalopram (LEXAPRO) 10 MG tablet Take 1/2 tablet daily x 7 days, then one tablet daily. 30 tablet 2  . LORazepam (ATIVAN) 0.5 MG tablet Take one tablet three times daily as needed for anxiety/paic attacks. 90 tablet  2   No current facility-administered medications for this visit.    Allergies  Allergen Reactions  . Amoxicillin Hives  . Amoxil [Amoxicillin] Rash    Diagnoses:    ICD-10-CM   1. Panic attack  F41.0      Plan: Patient is to use CBT, mindfulness and coping skills to help decrease  symptoms. Patient is is to follow through with coping skills discussed, as well as psychiatric evaluation.   Long-term goal:  Reduce overall level, frequency, and intensity of the feelings of anxiety and panic from SUD 7 or 8 to 0 or 1 for at least 3 consecutive months.  Short-term goal: To identify and process feelings related to grief and loss associated with his grandparents                    Decrease anxiety levels by utilizing coping skills discussed in session.                               Verbalize an understanding of the role that distorted thinking plays in creating fears, excessive worry, and ruminations.  Assessment of progress:  progressing    Anson [place], [facility]

## 2019-06-02 ENCOUNTER — Ambulatory Visit: Payer: BC Managed Care – PPO | Admitting: Mental Health

## 2019-06-03 ENCOUNTER — Ambulatory Visit: Payer: BC Managed Care – PPO | Admitting: Adult Health

## 2019-06-09 ENCOUNTER — Other Ambulatory Visit (HOSPITAL_COMMUNITY): Payer: BC Managed Care – PPO

## 2019-06-11 ENCOUNTER — Encounter (HOSPITAL_COMMUNITY): Payer: BC Managed Care – PPO

## 2019-06-12 DIAGNOSIS — J309 Allergic rhinitis, unspecified: Secondary | ICD-10-CM | POA: Diagnosis not present

## 2019-06-16 ENCOUNTER — Ambulatory Visit: Payer: BC Managed Care – PPO | Admitting: Mental Health

## 2019-06-18 ENCOUNTER — Ambulatory Visit: Payer: BC Managed Care – PPO | Admitting: Mental Health

## 2019-06-29 ENCOUNTER — Ambulatory Visit: Payer: BC Managed Care – PPO | Admitting: Adult Health

## 2019-07-03 ENCOUNTER — Other Ambulatory Visit: Payer: Self-pay | Admitting: Adult Health

## 2019-07-03 DIAGNOSIS — F411 Generalized anxiety disorder: Secondary | ICD-10-CM

## 2019-07-23 ENCOUNTER — Ambulatory Visit (INDEPENDENT_AMBULATORY_CARE_PROVIDER_SITE_OTHER): Payer: BC Managed Care – PPO | Admitting: Mental Health

## 2019-07-23 ENCOUNTER — Other Ambulatory Visit: Payer: Self-pay

## 2019-07-23 DIAGNOSIS — F410 Panic disorder [episodic paroxysmal anxiety] without agoraphobia: Secondary | ICD-10-CM | POA: Diagnosis not present

## 2019-07-23 DIAGNOSIS — F411 Generalized anxiety disorder: Secondary | ICD-10-CM | POA: Diagnosis not present

## 2019-07-23 NOTE — Progress Notes (Signed)
[facility] Psych[facility] note  Name: Brandon West Date: 07/23/2019 MRN: [id number] DOB: [birth year] PCP: Joycelyn Rua, MD  Time spent: 53 minutes  Treatment: individual therapy  Mental Status Exam:   Appearance:   Casual     Behavior:  Appropriate  Motor:  Normal  Speech/Language:   Clear and Coherent  Affect:  constricted  Mood:  anxious  Thought process:  normal  Thought content:    WNL  Sensory/Perceptual disturbances:    WNL  Orientation:  x4  Attention:  Good  Concentration:  Good  Memory:  WNL  Fund of knowledge:   Good  Insight:    Good  Judgment:   Good  Impulse Control:  Good   Reported Symptoms:   Some interrupted sleep, anxiety daily 5/6 in severity, some depression  Risk Assessment: Danger to Self:  No Self-injurious Behavior: No Danger to Others: No Duty to Warn:no Physical Aggression / Violence:No  Access to Firearms a concern: No  Gang Involvement:No  Patient / guardian was educated about steps to take if suicide or homicide risk level increases between visits: yes While future psychiatric events cannot be accurately predicted, the patient does not currently require acute inpatient psychiatric care and does not currently meet [facility] involuntary commitment criteria.   Subjective:  Patient presents for session on time.  We discussed progress since last visit which was about a month and a half ago.  He stated that he has some obsessive tendencies he wants to work on in therapy.  He stated that he counts calories daily.  He shared how about a year ago he became more health focused, working out and looked carefully at his food choices and diet.  Although this is healthy steps, he finds it very difficult not to count calories of everything he consumes, snacks meals etc.  He wants to have a healthy balance with eating healthy foods as well as being able to eat other foods that are not necessarily health focused.  He realizes how this ties into  his anxiety daily.  He stated that he stopped drinking alcohol about a week ago, that he was drinking up to about 12 beers a day to cope with stress and anxiety but how he had a negative impact on how he felt daily.  He shared some family history related to his father having an alcohol problem describing it as drinking daily and somewhat excessive amounts.  Made plan for him to not count calories for at least 3 days/week, only the first part of the day until after lunch, then he can count the evening calories.  He plans to spread the 3 days out evenly over the week and also made a plan to eat at least 1 enjoyable food choice that was not helpful focused and also not counting calories in this instance.  When not counting calories, this means not inputting the data in his phone.   Patient presents for session sharing progress. Stated that he continues to go to the gym throughout the week, as this is one of his main outlets for stress management. He continues also to have work-related stress, sharing how he has had to work many hours of overtime. He is not satisfied with his job, wants to make a change and is considering moving back to his parents house possibly in August and engaging in Medical sales representative school. He also feels being there with him will give him some needed support and comfort, he is often alone in his apartment, roommate  typically gone for 3 weeks at a time. This appears to affect him, increasing his rumination particularly at night. He has started taking melatonin with some positive effects to get to sleep quicker, may take about 30 to 40 minutes versus a few hours. Feels his current medications have been helpful and plans to attend his medication management appointment later today. He identified negative cognitions such as "I'm backtracking" referring to his progress and possibly having to move back home. We assisted him in identifying how he is identifying needed steps to take for self-care. We  reviewed coping, diaphragmatic breathing and some CBT concepts, thought blocking.  Interventions: CBT, supportive therapy, problem solving approaches  Medical History/Surgical History: Past Medical History:  Diagnosis Date  . Acne   . 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 ANKLE ARTHROSCOPY Right 01/07/2014   Procedure: RIGHT ANKLE ARTHROSCOPY WITH LIMITED DEBRIDEMENT  ;  Surgeon: Toni Arthurs, MD;  Location: [facility];  Service: Orthopedics;  Laterality: Right;  . ANKLE RECONSTRUCTION Right 01/07/2014   Procedure: RIGHT ANKLE LATERAL LIGAMENT RECONSTRUCTION AND PERONEUS BREVIS DEBRIDEMENT;  Surgeon: Toni Arthurs, MD;  Location: [facility];  Service: Orthopedics;  Laterality: Right;  . ANKLE SURGERY Right   . TEAR DUCT PROBING Bilateral 05/31/2000    Medications: Current Outpatient Medications  Medication Sig Dispense Refill  . escitalopram (LEXAPRO) 10 MG tablet TAKE 1/2 TABLET BY MOUTH ONCE A DAY FOR 7 DAYS ,THEN TAKE 1 TABLET BY MOUTH EVERY DAY 90 tablet 1  . LORazepam (ATIVAN) 0.5 MG tablet Take one tablet three times daily as needed for anxiety/paic attacks. 90 tablet 2   No current facility-administered medications for this visit.    Allergies  Allergen Reactions  . Amoxicillin Hives  . Amoxil [Amoxicillin] Rash    Diagnoses:    ICD-10-CM   1. Panic attack  F41.0   2. Generalized anxiety disorder  F41.1      Plan: Patient is to use CBT, mindfulness and coping skills to help decrease symptoms. Patient is is to follow through with coping skills discussed, as well as psychiatric evaluation.   Long-term goal:  Reduce overall level, frequency, and intensity of the feelings of anxiety and panic from SUD 7 or 8 to 0 or 1 for at least 3 consecutive months.  Short-term goal: To identify and process feelings related to grief and loss associated with his grandparents                    Decrease  anxiety levels by utilizing coping skills discussed in session.                               Verbalize an understanding of the role that distorted thinking plays in creating fears, excessive worry, and ruminations.  Assessment of progress:  progressing    Waldron Session, [facility]

## 2019-08-10 DIAGNOSIS — T3390XA Superficial frostbite of unspecified sites, initial encounter: Secondary | ICD-10-CM | POA: Diagnosis not present

## 2019-08-28 ENCOUNTER — Ambulatory Visit: Payer: BC Managed Care – PPO | Admitting: Mental Health

## 2019-09-09 ENCOUNTER — Ambulatory Visit: Payer: BC Managed Care – PPO | Admitting: Mental Health

## 2019-11-24 ENCOUNTER — Ambulatory Visit (HOSPITAL_COMMUNITY)
Admission: EM | Admit: 2019-11-24 | Discharge: 2019-11-24 | Disposition: A | Discharge status: Home / Self Care | Payer: BC Managed Care – PPO | Attending: Family Medicine | Admitting: Family Medicine

## 2019-11-24 ENCOUNTER — Other Ambulatory Visit: Payer: Self-pay

## 2019-11-24 ENCOUNTER — Encounter (HOSPITAL_COMMUNITY): Payer: Self-pay

## 2019-11-24 DIAGNOSIS — S76301A Unspecified injury of muscle, fascia and tendon of the posterior muscle group at thigh level, right thigh, initial encounter: Secondary | ICD-10-CM

## 2019-11-24 MED ORDER — HYDROCODONE-ACETAMINOPHEN 5-325 MG PO TABS: 1.0000 | ORAL_TABLET | Freq: Four times a day (QID) | ORAL | 0 refills | Status: AC | PRN

## 2019-11-24 NOTE — Discharge Instructions (Signed)

## 2019-11-24 NOTE — ED Triage Notes (Signed)
Pt in c/o sharp right leg pain. States that he was at the gym and bent down to put down dumbbells and felt a "snap or tear"  Pt has not had pain medication  States that pain is worse when leg is bent  Denies any numbness or tingling to lower extremities

## 2019-11-25 DIAGNOSIS — S76311A Strain of muscle, fascia and tendon of the posterior muscle group at thigh level, right thigh, initial encounter: Secondary | ICD-10-CM | POA: Diagnosis not present

## 2019-11-25 NOTE — ED Provider Notes (Signed)
[facility]   [id number] 11/24/19 Arrival Time: 1934  ASSESSMENT & PLAN:  1. Hamstring injury, right, initial encounter     No indication for plain imaging.  OTC ibup 800 TID with food.  Meds ordered this encounter  Medications  . HYDROcodone-acetaminophen (NORCO/VICODIN) 5-325 MG tablet    Sig: Take 1 tablet by mouth every 6 (six) hours as needed for moderate pain or severe pain.    Dispense:  8 tablet    Refill:  0   WBAT.  Recommend:  Follow-up Information    Schedule an appointment as soon as possible for a visit  with [facility].   Contact information: [address] [place] [address] [phone]              Reviewed expectations re: course of current medical issues. Questions answered. Outlined signs and symptoms indicating need for more acute intervention. Patient verbalized understanding. After Visit Summary given.  SUBJECTIVE: History from: patient. Brandon West is a 24 y.o. male who reports abrupt onset of R hamstring pain while at gym; bent down 'and felt a pop' in hamstring; immediate pain. Worse with any movement and wt bearing. No extremity sensation changes or weakness. No OTC tx.    Past Surgical History:  Procedure Laterality Date  . ANKLE ARTHROSCOPY Right 01/07/2014   Procedure: RIGHT ANKLE ARTHROSCOPY WITH LIMITED DEBRIDEMENT  ;  Surgeon: Toni Arthurs, MD;  Location: [facility];  Service: Orthopedics;  Laterality: Right;  . ANKLE RECONSTRUCTION Right 01/07/2014   Procedure: RIGHT ANKLE LATERAL LIGAMENT RECONSTRUCTION AND PERONEUS BREVIS DEBRIDEMENT;  Surgeon: Toni Arthurs, MD;  Location: [facility];  Service: Orthopedics;  Laterality: Right;  . ANKLE SURGERY Right   . TEAR DUCT PROBING Bilateral 05/31/2000      OBJECTIVE:  Vitals:   11/24/19 1949  BP: 130/77  Pulse: 62  Resp: 18  SpO2: 98%    General appearance: alert; no  distress HEENT: [place]; AT Neck: supple with FROM Resp: unlabored respirations Extremities: . RLE: warm with well perfused appearance; fairly well localized significant tenderness over right distal hamstring without bunching of muscle; no bruising Skin: warm and dry; no visible rashes Neurologic: gait favors RLE; normal sensation and strength of RLE Psychological: alert and cooperative; normal mood and affect   Allergies  Allergen Reactions  . Amoxicillin Hives  . Amoxil [Amoxicillin] Rash    Past Medical History:  Diagnosis Date  . Acne   . 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 Marital status: Single    Spouse name: Not on file  . Number of children: Not on file  . Years of education: Not on file  . Highest education level: Not on file  Occupational History  . Not on file  Tobacco Use  . Smoking status: Never Smoker  . Smokeless tobacco: Current User  Vaping Use  . Vaping Use: Former  Substance and Sexual Activity  . Alcohol use: No  . Drug use: No  . Sexual activity: Not on file  Other Topics Concern  . Not on file  Social History Narrative   ** Merged History Encounter **       Social Determinants of Health   Financial Resource Strain:   . Difficulty of Paying Living Expenses: Not on file  Food Insecurity:   . Worried About Programme researcher, broadcasting/film/video in the Last Year: Not on file  . Ran  Out of Food in the Last Year: Not on file  Transportation Needs:   . Lack of Transportation (Medical): Not on file  . Lack of Transportation (Non-Medical): Not on file  Physical Activity:   . Days of Exercise per Week: Not on file  . Minutes of Exercise per Session: Not on file  Stress:   . Feeling of Stress : Not on file  Social Connections:   . Frequency of Communication with Friends and Family: Not on file  . Frequency of Social Gatherings with Friends and Family: Not on file  . Attends Religious Services: Not on file  . Active Member  of Clubs or Organizations: Not on file  . Attends Banker Meetings: Not on file  . Marital Status: Not on file   Family History  Problem Relation Age of Onset  . Heart disease Maternal Grandfather        MI  . Healthy Mother   . Healthy Father    Past Surgical History:  Procedure Laterality Date  . ANKLE ARTHROSCOPY Right 01/07/2014   Procedure: RIGHT ANKLE ARTHROSCOPY WITH LIMITED DEBRIDEMENT  ;  Surgeon: Toni Arthurs, MD;  Location: [facility];  Service: Orthopedics;  Laterality: Right;  . ANKLE RECONSTRUCTION Right 01/07/2014   Procedure: RIGHT ANKLE LATERAL LIGAMENT RECONSTRUCTION AND PERONEUS BREVIS DEBRIDEMENT;  Surgeon: Toni Arthurs, MD;  Location: [facility];  Service: Orthopedics;  Laterality: Right;  . ANKLE SURGERY Right   . TEAR DUCT PROBING Bilateral 05/31/2000      Mardella Layman, MD 11/25/19 [phone]

## 2019-12-09 DIAGNOSIS — S76311A Strain of muscle, fascia and tendon of the posterior muscle group at thigh level, right thigh, initial encounter: Secondary | ICD-10-CM | POA: Diagnosis not present

## 2019-12-25 ENCOUNTER — Other Ambulatory Visit: Payer: Self-pay | Admitting: Adult Health

## 2019-12-25 DIAGNOSIS — F411 Generalized anxiety disorder: Secondary | ICD-10-CM

## 2019-12-29 DIAGNOSIS — Z20822 Contact with and (suspected) exposure to covid-19: Secondary | ICD-10-CM | POA: Diagnosis not present

## 2019-12-29 DIAGNOSIS — R109 Unspecified abdominal pain: Secondary | ICD-10-CM | POA: Diagnosis not present

## 2019-12-29 DIAGNOSIS — R5383 Other fatigue: Secondary | ICD-10-CM | POA: Diagnosis not present

## 2019-12-29 DIAGNOSIS — Z1152 Encounter for screening for COVID-19: Secondary | ICD-10-CM | POA: Diagnosis not present

## 2020-04-19 ENCOUNTER — Emergency Department (HOSPITAL_BASED_OUTPATIENT_CLINIC_OR_DEPARTMENT_OTHER): Payer: BC Managed Care – PPO

## 2020-04-19 ENCOUNTER — Other Ambulatory Visit: Payer: Self-pay

## 2020-04-19 ENCOUNTER — Emergency Department (HOSPITAL_BASED_OUTPATIENT_CLINIC_OR_DEPARTMENT_OTHER)
Admission: EM | Admit: 2020-04-19 | Discharge: 2020-04-19 | Disposition: A | Discharge status: Home / Self Care | Payer: BC Managed Care – PPO | Attending: Emergency Medicine | Admitting: Emergency Medicine

## 2020-04-19 ENCOUNTER — Encounter (HOSPITAL_BASED_OUTPATIENT_CLINIC_OR_DEPARTMENT_OTHER): Payer: Self-pay

## 2020-04-19 DIAGNOSIS — F17220 Nicotine dependence, chewing tobacco, uncomplicated: Secondary | ICD-10-CM | POA: Insufficient documentation

## 2020-04-19 DIAGNOSIS — M25561 Pain in right knee: Secondary | ICD-10-CM | POA: Insufficient documentation

## 2020-04-19 NOTE — ED Triage Notes (Signed)
Pt presents with right medial and posterior knee pain. States started with discomfort on Thursday, went to work yesterday on his feet for 10 hours and pain worsened. Denies known injury

## 2020-04-19 NOTE — Discharge Instructions (Addendum)
Recommend ice.  Recommend 800 mg of Motrin every 8 hours for the next 5 days and then as needed, recommend 1000 mg of Tylenol every 6 hours for the next 5 days and then as needed.  Follow-up with your orthopedic doctor follow-up with sports medicine/Dr. Jordan Likes.

## 2020-04-19 NOTE — ED Provider Notes (Signed)
[facility] EMERGENCY DEPARTMENT Provider Note   CSN: [id number] Arrival date & time: 04/19/20  1304     History Chief Complaint  Patient presents with  . Knee Pain    Brandon West is a 25 y.o. male.  The history is provided by the patient.  Knee Pain Location:  Knee Knee location:  R knee Pain details:    Quality:  Aching   Severity:  Mild   Onset quality:  Gradual   Timing:  Constant   Progression:  Unchanged Chronicity:  New Relieved by:  Nothing Worsened by:  Bearing weight Associated symptoms: stiffness   Associated symptoms: no back pain, no decreased ROM, no fatigue, no fever, no itching, no muscle weakness, no neck pain, no numbness, no swelling and no tingling        Past Medical History:  Diagnosis Date  . Acne   . 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   Past Surgical History:  Procedure Laterality Date  . ANKLE ARTHROSCOPY Right 01/07/2014   Procedure: RIGHT ANKLE ARTHROSCOPY WITH LIMITED DEBRIDEMENT  ;  Surgeon: Toni Arthurs, MD;  Location: [facility];  Service: Orthopedics;  Laterality: Right;  . ANKLE RECONSTRUCTION Right 01/07/2014   Procedure: RIGHT ANKLE LATERAL LIGAMENT RECONSTRUCTION AND PERONEUS BREVIS DEBRIDEMENT;  Surgeon: Toni Arthurs, MD;  Location: [facility];  Service: Orthopedics;  Laterality: Right;  . ANKLE SURGERY Right   . TEAR DUCT PROBING Bilateral 05/31/2000       Family History  Problem Relation Age of Onset  . Heart disease Maternal Grandfather        MI  . Healthy Mother   . Healthy Father     Social History   Tobacco Use  . Smoking status: Never Smoker  . Smokeless tobacco: Current User  Vaping Use  . Vaping Use: Former  Substance Use Topics  . Alcohol use: Yes    Comment: occasional  . Drug use: No    Home Medications Prior to Admission medications   Medication Sig Start Date End Date Taking? Authorizing Provider   escitalopram (LEXAPRO) 10 MG tablet TAKE 1/2 TABLET BY MOUTH ONCE A DAY FOR 7 DAYS ,THEN TAKE 1 TABLET BY MOUTH EVERY DAY 12/25/19  Yes Mozingo, Thereasa Solo, NP  HYDROcodone-acetaminophen (NORCO/VICODIN) 5-325 MG tablet Take 1 tablet by mouth every 6 (six) hours as needed for moderate pain or severe pain. 11/24/19   Mardella Layman, MD  LORazepam (ATIVAN) 0.5 MG tablet Take one tablet three times daily as needed for anxiety/paic attacks. 05/19/19   Mozingo, Thereasa Solo, NP    Allergies    Amoxicillin and Amoxil [amoxicillin]  Review of Systems   Review of Systems  Constitutional: Negative for fatigue and fever.  Musculoskeletal: Positive for arthralgias, gait problem and stiffness. Negative for back pain and neck pain.  Skin: Negative for color change, itching, pallor, rash and wound.  Neurological: Negative for weakness and numbness.    Physical Exam Updated Vital Signs BP 125/77 (BP Location: Right Arm)   Pulse 63   Temp 98.9 F (37.2 C) (Oral)   Resp 16   Ht 5\' 9"  (1.753 m)   Wt 74.8 kg   SpO2 100%   BMI 24.37 kg/m   Physical Exam Constitutional:      General: He is not in acute distress.    Appearance: He is not ill-appearing.  Musculoskeletal:        General:  Tenderness (TTP to medial portion of right knee) present. No swelling or deformity. Normal range of motion.     Cervical back: Normal range of motion.  Skin:    General: Skin is warm.     Capillary Refill: Capillary refill takes less than 2 seconds.  Neurological:     Mental Status: He is alert.     Sensory: No sensory deficit.     Motor: No weakness.     ED Results / Procedures / Treatments   Labs (all labs ordered are listed, but only abnormal results are displayed) Labs Reviewed - No data to display  EKG None  Radiology No results found.  Procedures Procedures   Medications Ordered in ED Medications - No data to display  ED Course  I have reviewed the triage vital signs and the nursing  notes.  Pertinent labs & imaging results that were available during my care of the patient were reviewed by me and considered in my medical decision making (see chart for details).    MDM Rules/Calculators/A&P                          Brandon West is here with right knee pain.  No specific trauma.  Normal vitals.  No fever.  Tenderness to the medial portion of the right knee.  May be a meniscal injury, could be a tendinitis.  Does not have any swelling of the knee or the leg.  No risk factors for blood clots.  No concern for blood clot.  Overall suspect musculoskeletal process.  No concern for major ACL or MCL tear but could have a mild sprain.  We will have him follow-up with sports medicine.  Recommend anti-inflammatories.  X-ray showed no fracture or malalignment.  Neurovascularly intact on exam.  No concern for septic joint.  This chart was dictated using voice recognition software.  Despite best efforts to proofread,  errors can occur which can change the documentation meaning.   Final Clinical Impression(s) / ED Diagnoses Final diagnoses:  Acute pain of right knee    Rx / DC Orders ED Discharge Orders    None       Virgina Norfolk, DO 04/19/20 1332

## 2020-04-22 DIAGNOSIS — M7051 Other bursitis of knee, right knee: Secondary | ICD-10-CM | POA: Diagnosis not present

## 2020-05-06 DIAGNOSIS — M25561 Pain in right knee: Secondary | ICD-10-CM | POA: Diagnosis not present

## 2020-05-31 DIAGNOSIS — M25561 Pain in right knee: Secondary | ICD-10-CM | POA: Diagnosis not present

## 2020-06-15 DIAGNOSIS — S8001XS Contusion of right knee, sequela: Secondary | ICD-10-CM | POA: Diagnosis not present

## 2020-06-28 DIAGNOSIS — M25561 Pain in right knee: Secondary | ICD-10-CM | POA: Diagnosis not present

## 2020-07-12 DIAGNOSIS — M25561 Pain in right knee: Secondary | ICD-10-CM | POA: Diagnosis not present

## 2020-07-20 DIAGNOSIS — M25561 Pain in right knee: Secondary | ICD-10-CM | POA: Diagnosis not present

## 2020-10-28 ENCOUNTER — Other Ambulatory Visit: Payer: Self-pay | Admitting: Adult Health

## 2020-10-28 DIAGNOSIS — F411 Generalized anxiety disorder: Secondary | ICD-10-CM

## 2020-11-26 ENCOUNTER — Other Ambulatory Visit: Payer: Self-pay | Admitting: Adult Health

## 2020-11-26 DIAGNOSIS — F411 Generalized anxiety disorder: Secondary | ICD-10-CM

## 2020-12-01 ENCOUNTER — Telehealth: Payer: Self-pay | Admitting: Adult Health

## 2020-12-01 DIAGNOSIS — F411 Generalized anxiety disorder: Secondary | ICD-10-CM

## 2020-12-01 NOTE — Telephone Encounter (Signed)
Called Pt to schedule apt. Advised to receive meds you have to be seen. Pt stated he will check work schedule and call back to schedule.

## 2020-12-01 NOTE — Telephone Encounter (Signed)
Please schedule appt.Provider refused request already due to no appt.

## 2020-12-08 NOTE — Telephone Encounter (Signed)
Pt aware must have apt to continue Rx.

## 2021-08-21 DIAGNOSIS — M25561 Pain in right knee: Secondary | ICD-10-CM | POA: Diagnosis not present

## 2021-08-30 DIAGNOSIS — M23206 Derangement of unspecified meniscus due to old tear or injury, right knee: Secondary | ICD-10-CM | POA: Diagnosis not present

## 2021-08-30 DIAGNOSIS — M25561 Pain in right knee: Secondary | ICD-10-CM | POA: Diagnosis not present

## 2021-09-04 DIAGNOSIS — M25561 Pain in right knee: Secondary | ICD-10-CM | POA: Diagnosis not present

## 2021-09-11 DIAGNOSIS — M6751 Plica syndrome, right knee: Secondary | ICD-10-CM | POA: Diagnosis not present

## 2021-09-14 DIAGNOSIS — F419 Anxiety disorder, unspecified: Secondary | ICD-10-CM | POA: Diagnosis not present

## 2021-09-14 DIAGNOSIS — Z6825 Body mass index (BMI) 25.0-25.9, adult: Secondary | ICD-10-CM | POA: Diagnosis not present

## 2021-09-14 DIAGNOSIS — D171 Benign lipomatous neoplasm of skin and subcutaneous tissue of trunk: Secondary | ICD-10-CM | POA: Diagnosis not present

## 2021-09-14 DIAGNOSIS — B360 Pityriasis versicolor: Secondary | ICD-10-CM | POA: Diagnosis not present

## 2022-09-05 IMAGING — DX DG KNEE COMPLETE 4+V*R*
4 series · 4 of 4 positions shown · non-contrast
Comparison: None.

CLINICAL DATA: Right knee pain

EXAM:
RIGHT KNEE - COMPLETE 4+ VIEW

[knee ap]
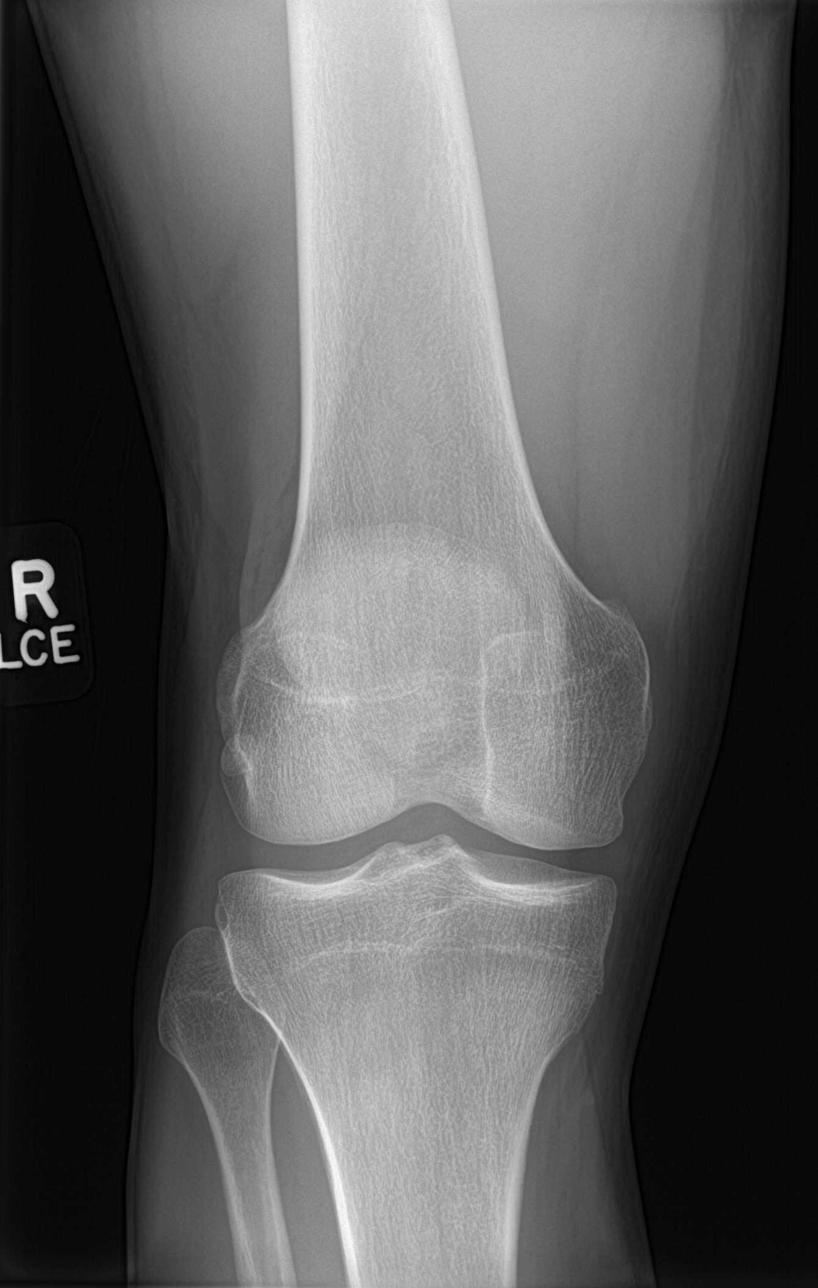

[knee lat]
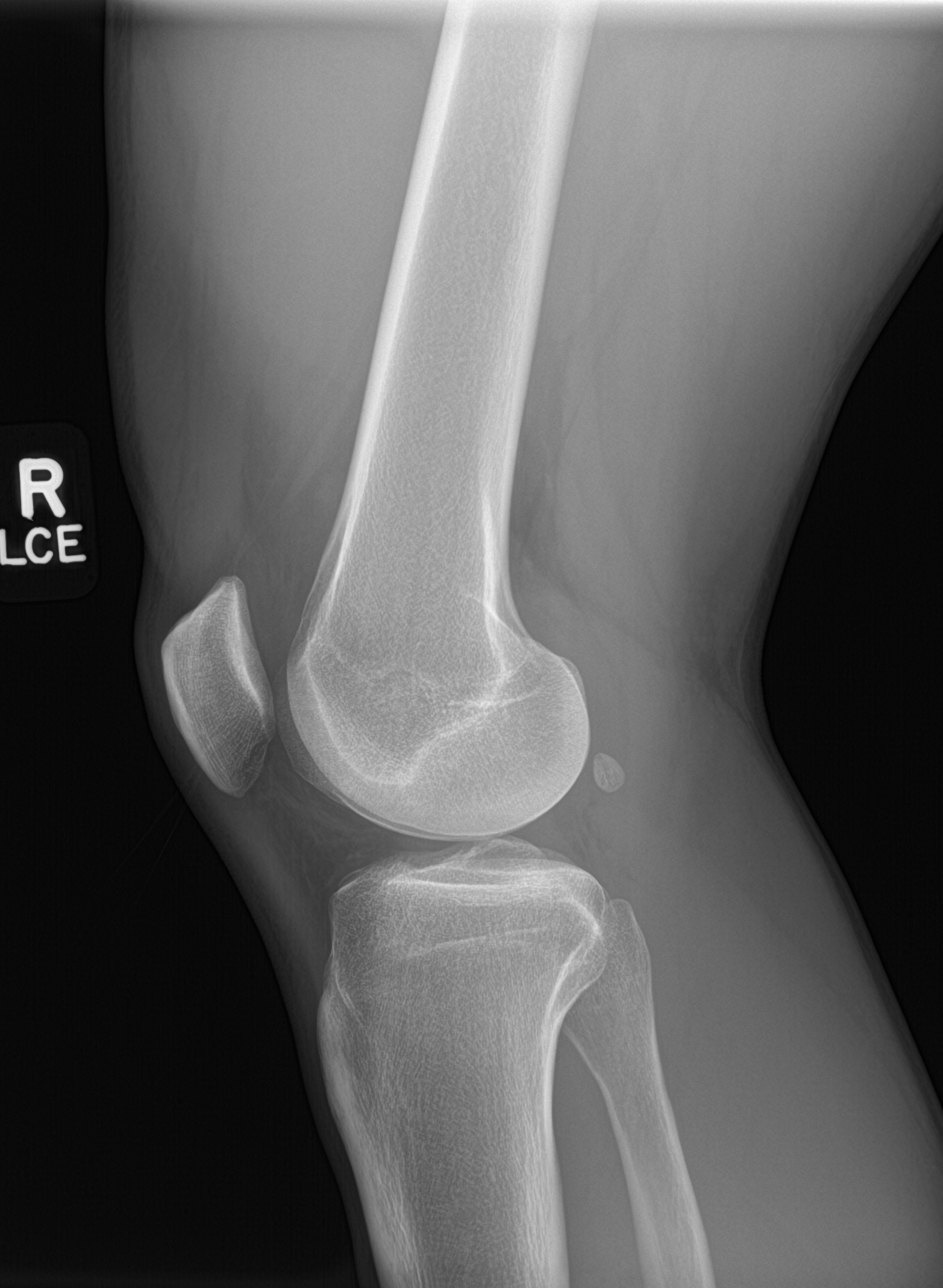

[knee obl (1 of 2)]
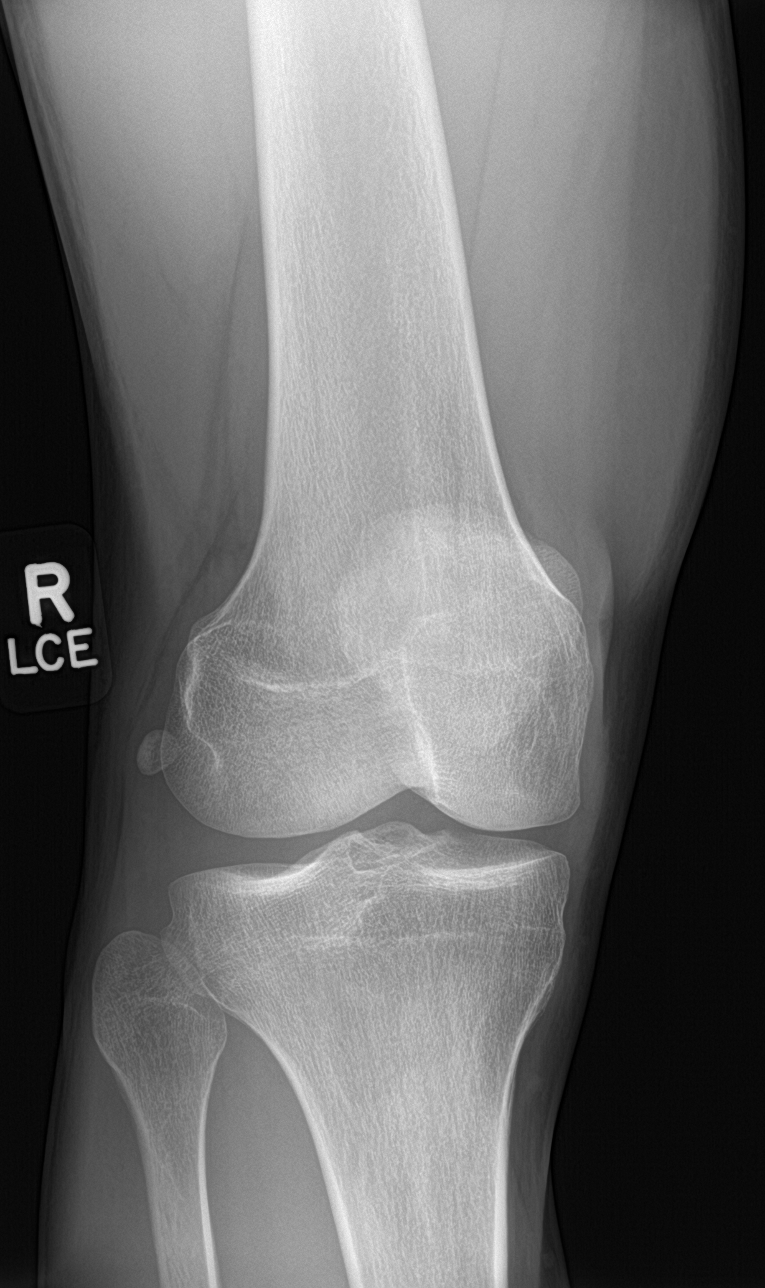

[knee obl (2 of 2)]
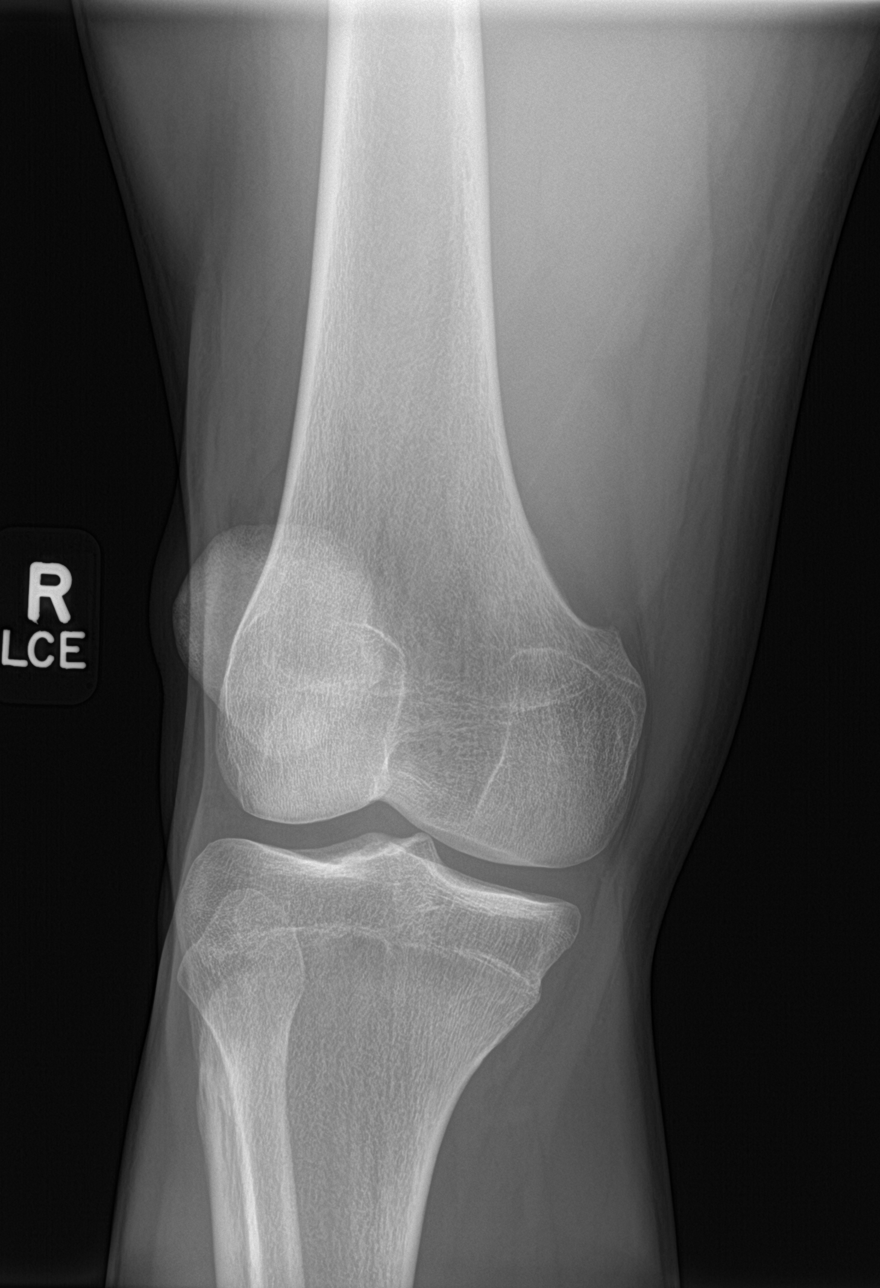

[4 of 4 positions shown; findings below may reference images not displayed]

FINDINGS: No evidence of fracture, dislocation, or joint effusion. No evidence
of arthropathy or other focal bone abnormality. Soft tissues are
unremarkable.
IMPRESSION: Negative.

## 2023-01-13 ENCOUNTER — Encounter (HOSPITAL_BASED_OUTPATIENT_CLINIC_OR_DEPARTMENT_OTHER): Payer: Self-pay | Admitting: Emergency Medicine

## 2023-01-13 ENCOUNTER — Emergency Department (HOSPITAL_BASED_OUTPATIENT_CLINIC_OR_DEPARTMENT_OTHER)
Admission: EM | Admit: 2023-01-13 | Discharge: 2023-01-13 | Disposition: A | Discharge status: Home / Self Care | Payer: Self-pay | Attending: Emergency Medicine | Admitting: Emergency Medicine

## 2023-01-13 ENCOUNTER — Other Ambulatory Visit: Payer: Self-pay

## 2023-01-13 ENCOUNTER — Emergency Department (HOSPITAL_BASED_OUTPATIENT_CLINIC_OR_DEPARTMENT_OTHER): Payer: Self-pay

## 2023-01-13 DIAGNOSIS — Y990 Civilian activity done for income or pay: Secondary | ICD-10-CM | POA: Insufficient documentation

## 2023-01-13 DIAGNOSIS — S82891A Other fracture of right lower leg, initial encounter for closed fracture: Secondary | ICD-10-CM | POA: Diagnosis not present

## 2023-01-13 DIAGNOSIS — X501XXA Overexertion from prolonged static or awkward postures, initial encounter: Secondary | ICD-10-CM | POA: Insufficient documentation

## 2023-01-13 HISTORY — DX: Anxiety disorder, unspecified: F41.9

## 2023-01-13 MED ORDER — OXYCODONE-ACETAMINOPHEN 5-325 MG PO TABS
2.0000 | ORAL_TABLET | Freq: Once | ORAL | Status: AC
  Administered 2023-01-13: 2 via ORAL
  Filled 2023-01-13: qty 2

## 2023-01-13 MED ORDER — OXYCODONE-ACETAMINOPHEN 5-325 MG PO TABS: 1.0000 | ORAL_TABLET | Freq: Four times a day (QID) | ORAL | 0 refills | Status: AC | PRN

## 2023-01-13 NOTE — ED Notes (Signed)
Reviewed AVS/discharge instruction with patient. Time allotted for and all questions answered. Patient is agreeable for d/c and escorted to ed exit by staff.  

## 2023-01-13 NOTE — Discharge Instructions (Addendum)
You were seen in the ER today for concerns of ankle pain.  Your x-ray shows that there is a fracture to the inferior tip of the lateral aspect of the right ankle.  This is the area where you have significant swelling and pain.  You are placed into an ankle brace for stability but please place yourself into your boot once you are able to get home.  I have sent a prescription for Percocet to your pharmacy for continued pain control.  Please follow-up with orthopedics for further evaluation.

## 2023-01-13 NOTE — ED Triage Notes (Signed)
Pt stepped off piece of equipment and twisted his right ankle and heard a snap. Swollen/can't bear weight.

## 2023-01-13 NOTE — ED Provider Notes (Addendum)
[place] [facility]ERGENC[facility] DEPARTMENT AT [facility] Provider Note   CSN: [id number] Arrival date & time: 01/13/23  1914     History No chief complaint on file.   Brandon West is a 27 y.o. male.  Patient with past history significant for ankle reconstruction presents the emergency department concerns of right ankle pain.  He reports that he stepped off a surface while at work and twisted his ankle.  He heard a snap or pop and now cannot bear weight on this ankle.  Endorses swelling but denies any bruising.  Prior ankle reconstruction was in 2014 based on patient's estimates.  No medications taken prior to arriving.  Denies any significant fall or injury of any other area.  Endorses some mild paresthesia over the dorsal aspect of the right foot.  HPI     Home Medications Prior to Admission medications   Medication Sig Start Date End Date Taking? Authorizing Provider  oxyCODONE-acetaminophen (PERCOCET/ROXICET) 5-325 MG tablet Take 1 tablet by mouth every 6 (six) hours as needed for severe pain (pain score 7-10). 01/13/23  Yes Maryanna Shape A, PA-C  escitalopram (LEXAPRO) 10 MG tablet TAKE 1/2 TABLET BY MOUTH ONCE A DAY FOR 7 DAYS ,THEN TAKE 1 TABLET BY MOUTH EVERY DAY 12/25/19   Mozingo, Thereasa Solo, NP  HYDROcodone-acetaminophen (NORCO/VICODIN) 5-325 MG tablet Take 1 tablet by mouth every 6 (six) hours as needed for moderate pain or severe pain. 11/24/19   Mardella Layman, MD  LORazepam (ATIVAN) 0.5 MG tablet Take one tablet three times daily as needed for anxiety/paic attacks. 05/19/19   Mozingo, Thereasa Solo, NP      Allergies    Amoxicillin and Amoxil [amoxicillin]    Review of Systems   Review of Systems  Musculoskeletal:  Positive for joint swelling.  All other systems reviewed and are negative.   Physical Exam Updated Vital Signs BP 133/70 (BP Location: Left Arm)   Pulse (!) 55   Temp 97.7 F (36.5 C)   Resp 16   Wt 74.8 kg   SpO2 99%   BMI 24.37 kg/m   Physical Exam Vitals and nursing note reviewed.  Constitutional:      General: He is not in acute distress.    Appearance: He is well-developed.  HENT:     Head: Normocephalic and atraumatic.  Eyes:     Conjunctiva/sclera: Conjunctivae normal.  Cardiovascular:     Rate and Rhythm: Normal rate and regular rhythm.     Heart sounds: No murmur heard. Pulmonary:     Effort: Pulmonary effort is normal. No respiratory distress.     Breath sounds: Normal breath sounds.  Abdominal:     Palpations: Abdomen is soft.     Tenderness: There is no abdominal tenderness.  Musculoskeletal:        General: Swelling, tenderness, deformity and signs of injury present. Normal range of motion.     Cervical back: Neck supple.     Comments: Notable swelling over the lateral malleolus of the right ankle.  No significant bruising seen.  Skin:    General: Skin is warm and dry.     Capillary Refill: Capillary refill takes less than 2 seconds.  Neurological:     Mental Status: He is alert.  Psychiatric:        Mood and Affect: Mood normal.     ED Results / Procedures / Treatments   Labs (all labs ordered are listed, but only abnormal results are displayed) Labs Reviewed - No data to  display  EKG None  Radiology DG Ankle Complete Right Result Date: 01/13/2023 CLINICAL DATA:  2 sting injury to the ankle. Swelling. Unable to bear weight. EXAM: RIGHT ANKLE - COMPLETE 3+ VIEW COMPARISON:  None Available. FINDINGS: Comminuted fracture involving the tip of the inferior fibula noted. No other acute fracture evident. No subluxation or dislocation. Substantial soft tissue swelling noted laterally. IMPRESSION: Comminuted fracture involving the inferior tip of the fibula with substantial soft tissue swelling. Electronically Signed   By: Kennith Center M.D.   On: 01/13/2023 11:02    Procedures Procedures   Medications Ordered in ED Medications  oxyCODONE-acetaminophen (PERCOCET/ROXICET) 5-325 MG per tablet  2 tablet (has no administration in time range)    ED Course/ Medical Decision Making/ A&P                                 Medical Decision Making Amount and/or Complexity of Data Reviewed Radiology: ordered.  Risk Prescription drug management.   This patient presents to the ED for concern of ankle pain.  Differential diagnosis includes ankle dislocation, ankle fracture, ankle sprain, midfoot fracture   Imaging Studies ordered:  I ordered imaging studies including x-ray right ankle. I independently visualized and interpreted imaging which showed Comminuted fracture involving the inferior tip of the fibula with substantial soft tissue swelling. I agree with the radiologist interpretation   Medicines ordered and prescription drug management:  I ordered medication including Percocet for pain Reevaluation of the patient after these medicines showed that the patient improved I have reviewed the patients home medicines and have made adjustments as needed   Problem List / ED Course:  Patient presents the emergency department concerns of right ankle pain.  Reports he stepped off a surface while at work and rolled his right ankle.  Felt a snap in the area and now unable to bear weight.  Notable swelling has been worsening since the initial injury several hours ago. He reports that he has previously had ankle reconstruction surgery about 10 years ago.  Was seen by [facility] at that time.  Will plan on reaching out to them for further evaluation.  X-ray imaging obtained from triage for assessment of the ankle injury.  No medications taken prior to arriving.  Not on blood thinners. X-ray concerning for a comminuted fracture of the distal tip of the right fibula.  This coincides with the area of pain and tenderness patient has reported.  Percocet 10 mg given for pain control.  Will discharge home with a short course this medication with plans for outpatient follow-up. Patient advises  that he has a walking boot as well as crutches at home from his prior ankle injuries and will plan on using the same boot.  Will place patient into an ASO lace up brace in the meantime as an intermediate stabilizer until he can get placed into the ankle boot.  He reports that he also has crutches available at home and will be using these.  Advise return precautions such as worsening symptoms, neurovascular compromise, or other acute or new symptoms.  Advise close follow-up with orthopedics.  Patient discharged home in stable condition.   Social Determinants of Health:  Prior orthopedic repair of ankle fracture of the right ankle  Final Clinical Impression(s) / ED Diagnoses Final diagnoses:  Closed fracture of right ankle, initial encounter    Rx / DC Orders ED Discharge Orders  Ordered    oxyCODONE-acetaminophen (PERCOCET/ROXICET) 5-325 MG tablet  Every 6 hours PRN        01/13/23 1130              Smitty Knudsen, PA-C 01/13/23 1131    Smitty Knudsen, PA-C 01/13/23 1132    Virgina Norfolk, DO 01/13/23 1438

## 2023-01-14 DIAGNOSIS — S8264XA Nondisplaced fracture of lateral malleolus of right fibula, initial encounter for closed fracture: Secondary | ICD-10-CM | POA: Diagnosis not present

## 2023-01-14 DIAGNOSIS — M25571 Pain in right ankle and joints of right foot: Secondary | ICD-10-CM | POA: Diagnosis not present

## 2023-02-04 DIAGNOSIS — S8264XD Nondisplaced fracture of lateral malleolus of right fibula, subsequent encounter for closed fracture with routine healing: Secondary | ICD-10-CM | POA: Diagnosis not present

## 2023-02-25 DIAGNOSIS — S8264XS Nondisplaced fracture of lateral malleolus of right fibula, sequela: Secondary | ICD-10-CM | POA: Diagnosis not present

## 2023-02-28 DIAGNOSIS — S8264XS Nondisplaced fracture of lateral malleolus of right fibula, sequela: Secondary | ICD-10-CM | POA: Diagnosis not present

## 2023-03-04 DIAGNOSIS — S8264XS Nondisplaced fracture of lateral malleolus of right fibula, sequela: Secondary | ICD-10-CM | POA: Diagnosis not present

## 2023-03-07 DIAGNOSIS — S8264XS Nondisplaced fracture of lateral malleolus of right fibula, sequela: Secondary | ICD-10-CM | POA: Diagnosis not present

## 2023-03-12 DIAGNOSIS — S8264XS Nondisplaced fracture of lateral malleolus of right fibula, sequela: Secondary | ICD-10-CM | POA: Diagnosis not present

## 2023-03-14 DIAGNOSIS — S8264XS Nondisplaced fracture of lateral malleolus of right fibula, sequela: Secondary | ICD-10-CM | POA: Diagnosis not present

## 2023-06-07 DIAGNOSIS — R5383 Other fatigue: Secondary | ICD-10-CM | POA: Diagnosis not present

## 2023-06-07 DIAGNOSIS — R6882 Decreased libido: Secondary | ICD-10-CM | POA: Diagnosis not present

## 2023-06-07 DIAGNOSIS — M791 Myalgia, unspecified site: Secondary | ICD-10-CM | POA: Diagnosis not present

## 2023-06-07 DIAGNOSIS — R634 Abnormal weight loss: Secondary | ICD-10-CM | POA: Diagnosis not present

## 2023-06-20 DIAGNOSIS — R6882 Decreased libido: Secondary | ICD-10-CM | POA: Diagnosis not present
